# Patient Record
Sex: Female | Born: 1970
Health system: Southern US, Community
[De-identification: ages and names within clinical notes are randomized; demographics above are authoritative.]

## PROBLEM LIST (undated history)

## (undated) DIAGNOSIS — G43909 Migraine, unspecified, not intractable, without status migrainosus: Secondary | ICD-10-CM

## (undated) DIAGNOSIS — J45909 Unspecified asthma, uncomplicated: Secondary | ICD-10-CM

## (undated) DIAGNOSIS — E079 Disorder of thyroid, unspecified: Secondary | ICD-10-CM

## (undated) DIAGNOSIS — R112 Nausea with vomiting, unspecified: Secondary | ICD-10-CM

## (undated) DIAGNOSIS — Z8489 Family history of other specified conditions: Secondary | ICD-10-CM

## (undated) DIAGNOSIS — F419 Anxiety disorder, unspecified: Secondary | ICD-10-CM

## (undated) DIAGNOSIS — K219 Gastro-esophageal reflux disease without esophagitis: Secondary | ICD-10-CM

## (undated) DIAGNOSIS — F329 Major depressive disorder, single episode, unspecified: Secondary | ICD-10-CM

## (undated) DIAGNOSIS — R200 Anesthesia of skin: Secondary | ICD-10-CM

## (undated) DIAGNOSIS — R202 Paresthesia of skin: Secondary | ICD-10-CM

## (undated) DIAGNOSIS — Z973 Presence of spectacles and contact lenses: Secondary | ICD-10-CM

## (undated) DIAGNOSIS — E119 Type 2 diabetes mellitus without complications: Secondary | ICD-10-CM

## (undated) DIAGNOSIS — Z9889 Other specified postprocedural states: Secondary | ICD-10-CM

## (undated) DIAGNOSIS — F32A Depression, unspecified: Secondary | ICD-10-CM

## (undated) DIAGNOSIS — M542 Cervicalgia: Secondary | ICD-10-CM

## (undated) DIAGNOSIS — G8929 Other chronic pain: Secondary | ICD-10-CM

## (undated) DIAGNOSIS — R51 Headache: Secondary | ICD-10-CM

## (undated) DIAGNOSIS — R519 Headache, unspecified: Secondary | ICD-10-CM

## (undated) HISTORY — DX: Migraine, unspecified, not intractable, without status migrainosus: G43.909

## (undated) HISTORY — DX: Type 2 diabetes mellitus without complications: E11.9

## (undated) HISTORY — DX: Disorder of thyroid, unspecified: E07.9

## (undated) HISTORY — PX: WISDOM TOOTH EXTRACTION: SHX21

---

## 2014-06-20 ENCOUNTER — Other Ambulatory Visit (HOSPITAL_COMMUNITY): Payer: Self-pay | Admitting: Neurosurgery

## 2014-06-29 ENCOUNTER — Other Ambulatory Visit: Payer: Self-pay | Admitting: Neurosurgery

## 2014-06-29 DIAGNOSIS — M542 Cervicalgia: Secondary | ICD-10-CM

## 2014-07-11 NOTE — Pre-Procedure Instructions (Signed)
Wendy ReeveShelia Monroe  07/11/2014   Your procedure is scheduled on:  Wednesday, December 9th  [facility] Admitting at 630 AM.  Call this number if you have problems the morning of surgery: [phone]   Remember:   Do not eat food or drink liquids after midnight Tuesday, July 18, 2014   Take these medicines the morning of surgery with A SIP OF WATER: buPROPion (WELLBUTRIN XL), divalproex (DEPAKOTE), escitalopram (LEXAPRO), fexofenadine (ALLEGRA), lansoprazole (PREVACID), fluticasone (FLONASE) if needed:traMADol (ULTRAM) for pain,  albuterol (PROVENTIL HFA;VENTOLIN HFA) 108 (90 BASE) MCG/ACT inhaler wheezing or shortness of breath ( Bringh inhaler in with you on day of procedure) Stop taking Aspirin,vitamins, and herbal medications (Melatonin) Do not take any NSAIDs ie: Ibuprofen, Advil, Naproxen or any medication containing Aspirin ; stop 5 days prior to procedure Friday, July 14, 2014)  Do not wear jewelry, make-up or nail polish.  Do not wear lotions, powders, or perfume,deodorant.  Do not shave 48 hours prior to surgery.   Do not bring valuables to the hospital.  [facility] is not responsible  for any belongings or valuables.               Contacts, dentures or bridgework may not be worn into surgery.  Leave suitcase in the car. After surgery it may be brought to your room.  For patients admitted to the hospital, discharge time is determined by your treatment team.               Patients discharged the day of surgery will not be allowed to drive home.  Please read over the following fact sheets that you were given: Pain Booklet, Coughing and Deep Breathing, MRSA Information and Surgical Site Infection Prevention  [facility] - Preparing for Surgery  Before surgery, you can play an important role.  Because skin is not sterile, your skin needs to be as free of germs as possible.  You can reduce the number of germs on you skin by washing with CHG (chlorahexidine  gluconate) soap before surgery.  CHG is an antiseptic cleaner which kills germs and bonds with the skin to continue killing germs even after washing.  Please DO NOT use if you have an allergy to CHG or antibacterial soaps.  If your skin becomes reddened/irritated stop using the CHG and inform your nurse when you arrive at Short Stay.  Do not shave (including legs and underarms) for at least 48 hours prior to the first CHG shower.  You may shave your face.  Please follow these instructions carefully:   1.  Shower with CHG Soap the night before surgery and the morning of Surgery.  2.  If you choose to wash your hair, wash your hair first as usual with your normal shampoo.  3.  After you shampoo, rinse your hair and body thoroughly to remove the shampoo.  4.  Use CHG as you would any other liquid soap.  You can apply CHG directly to the skin and wash gently with scrungie or a clean washcloth.  5.  Apply the CHG Soap to your body ONLY FROM THE NECK DOWN.  Do not use on open wounds or open sores.  Avoid contact with your eyes, ears, mouth and genitals (private parts).  Wash genitals (private parts) with your normal soap.  6.  Wash thoroughly, paying special attention to the area where your surgery will be performed.  7.  Thoroughly rinse your body with warm water from the neck down.  8.  DO NOT shower/wash with your normal soap after using and rinsing off the CHG Soap.  9.  Pat yourself dry with a clean towel.            10.  Wear clean pajamas.            11.  Place clean sheets on your bed the night of your first shower and do not sleep with pets.  Day of Surgery  Do not apply any lotions/deoderants the morning of surgery.  Please wear clean clothes to the hospital/surgery center.

## 2014-07-12 ENCOUNTER — Encounter (HOSPITAL_COMMUNITY): Payer: Self-pay

## 2014-07-12 ENCOUNTER — Encounter (HOSPITAL_COMMUNITY)
Admission: RE | Admit: 2014-07-12 | Discharge: 2014-07-12 | Disposition: A | Discharge status: Home / Self Care | Payer: PRIVATE HEALTH INSURANCE | Source: Ambulatory Visit | Attending: Neurosurgery | Admitting: Neurosurgery

## 2014-07-12 DIAGNOSIS — Z01812 Encounter for preprocedural laboratory examination: Secondary | ICD-10-CM | POA: Insufficient documentation

## 2014-07-12 HISTORY — DX: Anxiety disorder, unspecified: F41.9

## 2014-07-12 HISTORY — DX: Unspecified asthma, uncomplicated: J45.909

## 2014-07-12 HISTORY — DX: Anesthesia of skin: R20.0

## 2014-07-12 HISTORY — DX: Gastro-esophageal reflux disease without esophagitis: K21.9

## 2014-07-12 HISTORY — DX: Nausea with vomiting, unspecified: R11.2

## 2014-07-12 HISTORY — DX: Headache, unspecified: R51.9

## 2014-07-12 HISTORY — DX: Family history of other specified conditions: Z84.89

## 2014-07-12 HISTORY — DX: Major depressive disorder, single episode, unspecified: F32.9

## 2014-07-12 HISTORY — DX: Other chronic pain: G89.29

## 2014-07-12 HISTORY — DX: Other specified postprocedural states: Z98.890

## 2014-07-12 HISTORY — DX: Depression, unspecified: F32.A

## 2014-07-12 HISTORY — DX: Anesthesia of skin: R20.2

## 2014-07-12 HISTORY — DX: Cervicalgia: M54.2

## 2014-07-12 HISTORY — DX: Presence of spectacles and contact lenses: Z97.3

## 2014-07-12 HISTORY — DX: Headache: R51

## 2014-07-12 LAB — BASIC METABOLIC PANEL
Anion gap: 13 (ref 5–15)
BUN: 10 mg/dL (ref 6–23)
CO2: 25 meq/L (ref 19–32)
Calcium: 9 mg/dL (ref 8.4–10.5)
Chloride: 101 mEq/L (ref 96–112)
Creatinine, Ser: 0.67 mg/dL (ref 0.50–1.10)
GFR calc Af Amer: >90 mL/min (ref >90)
Glucose, Bld: 140 mg/dL — ABNORMAL HIGH (ref 70–99)
Potassium: 4.3 mEq/L (ref 3.7–5.3)
SODIUM: 139 meq/L (ref 137–147)

## 2014-07-12 LAB — CBC
HCT: 38.6 % (ref 36.0–46.0)
Hemoglobin: 12.8 g/dL (ref 12.0–15.0)
MCH: 29.1 pg (ref 26.0–34.0)
MCHC: 33.2 g/dL (ref 30.0–36.0)
MCV: 87.7 fL (ref 78.0–100.0)
PLATELETS: 401 10*3/uL — AB (ref 150–400)
RBC: 4.4 MIL/uL (ref 3.87–5.11)
RDW: 13.2 % (ref 11.5–15.5)
WBC: 10.7 10*3/uL — ABNORMAL HIGH (ref 4.0–10.5)

## 2014-07-12 LAB — SURGICAL PCR SCREEN
MRSA, PCR: NEGATIVE
Staphylococcus aureus: POSITIVE — AB

## 2014-07-12 LAB — HCG, SERUM, QUALITATIVE: Preg, Serum: NEGATIVE

## 2014-07-12 NOTE — Progress Notes (Signed)
Pt denies SOB, chest pain, and being under the care of a cardiologist. Pt denies having a chest x ray and EKG within the last year. Pt denies having an echo and cardiac cath but stated that a stress test was done at [facility]; results requested along with sleep study test results, LOV notes and any other cardiac studies.

## 2014-07-14 ENCOUNTER — Other Ambulatory Visit: Payer: PRIVATE HEALTH INSURANCE

## 2014-07-17 ENCOUNTER — Ambulatory Visit
Admission: RE | Admit: 2014-07-17 | Discharge: 2014-07-17 | Disposition: A | Discharge status: Home / Self Care | Payer: PRIVATE HEALTH INSURANCE | Source: Ambulatory Visit | Attending: Neurosurgery | Admitting: Neurosurgery

## 2014-07-17 DIAGNOSIS — M542 Cervicalgia: Secondary | ICD-10-CM

## 2014-07-19 ENCOUNTER — Inpatient Hospital Stay (HOSPITAL_COMMUNITY)
Admission: RE | Admit: 2014-07-19 | Discharge: 2014-07-21 | DRG: 473 | Disposition: A | Discharge status: Home / Self Care | Payer: PRIVATE HEALTH INSURANCE | Source: Ambulatory Visit | Attending: Neurosurgery | Admitting: Neurosurgery

## 2014-07-19 ENCOUNTER — Inpatient Hospital Stay (HOSPITAL_COMMUNITY): Payer: PRIVATE HEALTH INSURANCE

## 2014-07-19 ENCOUNTER — Inpatient Hospital Stay (HOSPITAL_COMMUNITY): Payer: PRIVATE HEALTH INSURANCE | Admitting: Certified Registered"

## 2014-07-19 ENCOUNTER — Encounter (HOSPITAL_COMMUNITY): Payer: Self-pay | Admitting: Surgery

## 2014-07-19 ENCOUNTER — Encounter (HOSPITAL_COMMUNITY)
Admission: RE | Disposition: A | Discharge status: Home / Self Care | Payer: Self-pay | Source: Ambulatory Visit | Attending: Neurosurgery

## 2014-07-19 DIAGNOSIS — F329 Major depressive disorder, single episode, unspecified: Secondary | ICD-10-CM | POA: Diagnosis present

## 2014-07-19 DIAGNOSIS — M542 Cervicalgia: Secondary | ICD-10-CM | POA: Diagnosis present

## 2014-07-19 DIAGNOSIS — J45909 Unspecified asthma, uncomplicated: Secondary | ICD-10-CM | POA: Diagnosis present

## 2014-07-19 DIAGNOSIS — F419 Anxiety disorder, unspecified: Secondary | ICD-10-CM | POA: Diagnosis present

## 2014-07-19 DIAGNOSIS — K219 Gastro-esophageal reflux disease without esophagitis: Secondary | ICD-10-CM | POA: Diagnosis present

## 2014-07-19 DIAGNOSIS — M532X2 Spinal instabilities, cervical region: Principal | ICD-10-CM | POA: Diagnosis present

## 2014-07-19 DIAGNOSIS — Z79899 Other long term (current) drug therapy: Secondary | ICD-10-CM

## 2014-07-19 DIAGNOSIS — M438X1 Other specified deforming dorsopathies, occipito-atlanto-axial region: Secondary | ICD-10-CM | POA: Diagnosis present

## 2014-07-19 DIAGNOSIS — Z419 Encounter for procedure for purposes other than remedying health state, unspecified: Secondary | ICD-10-CM

## 2014-07-19 HISTORY — PX: POSTERIOR CERVICAL FUSION/FORAMINOTOMY: SHX5038

## 2014-07-19 SURGERY — POSTERIOR CERVICAL FUSION/FORAMINOTOMY LEVEL 1
Anesthesia: General

## 2014-07-19 MED ORDER — PROPOFOL 10 MG/ML IV BOLUS
INTRAVENOUS | Status: DC | PRN
  Administered 2014-07-19: 200 mg via INTRAVENOUS

## 2014-07-19 MED ORDER — LORATADINE 10 MG PO TABS
10.0000 mg | ORAL_TABLET | Freq: Every day | ORAL | Status: DC
  Administered 2014-07-20: 10 mg via ORAL
  Filled 2014-07-19 (×2): qty 1

## 2014-07-19 MED ORDER — SUFENTANIL CITRATE 50 MCG/ML IV SOLN
INTRAVENOUS | Status: AC
  Filled 2014-07-19: qty 1

## 2014-07-19 MED ORDER — ONDANSETRON HCL 4 MG/2ML IJ SOLN
INTRAMUSCULAR | Status: AC
  Filled 2014-07-19: qty 4

## 2014-07-19 MED ORDER — SODIUM CHLORIDE 0.9 % IJ SOLN
INTRAMUSCULAR | Status: AC
  Filled 2014-07-19: qty 10

## 2014-07-19 MED ORDER — HYDROMORPHONE HCL 1 MG/ML IJ SOLN
0.2500 mg | INTRAMUSCULAR | Status: DC | PRN
  Administered 2014-07-19 (×4): 0.5 mg via INTRAVENOUS

## 2014-07-19 MED ORDER — DEXAMETHASONE SODIUM PHOSPHATE 4 MG/ML IJ SOLN
INTRAMUSCULAR | Status: DC | PRN
  Administered 2014-07-19: 4 mg via INTRAVENOUS

## 2014-07-19 MED ORDER — LIDOCAINE HCL (CARDIAC) 20 MG/ML IV SOLN
INTRAVENOUS | Status: DC | PRN
  Administered 2014-07-19: 50 mg via INTRAVENOUS

## 2014-07-19 MED ORDER — PROPOFOL 10 MG/ML IV BOLUS
INTRAVENOUS | Status: AC
  Filled 2014-07-19: qty 20

## 2014-07-19 MED ORDER — MEPERIDINE HCL 25 MG/ML IJ SOLN: 6.2500 mg | INTRAMUSCULAR | Status: DC | PRN

## 2014-07-19 MED ORDER — MIDAZOLAM HCL 5 MG/5ML IJ SOLN
INTRAMUSCULAR | Status: DC | PRN
  Administered 2014-07-19: 2 mg via INTRAVENOUS

## 2014-07-19 MED ORDER — BUPIVACAINE-EPINEPHRINE 0.5% -1:200000 IJ SOLN
INTRAMUSCULAR | Status: DC | PRN
  Administered 2014-07-19: 10 mL

## 2014-07-19 MED ORDER — HYDROCODONE-ACETAMINOPHEN 5-325 MG PO TABS
1.0000 | ORAL_TABLET | ORAL | Status: DC | PRN
  Administered 2014-07-21: 2 via ORAL
  Filled 2014-07-19: qty 2

## 2014-07-19 MED ORDER — CEFAZOLIN SODIUM-DEXTROSE 2-3 GM-% IV SOLR
INTRAVENOUS | Status: AC
  Filled 2014-07-19: qty 50

## 2014-07-19 MED ORDER — MENTHOL 3 MG MT LOZG
1.0000 | LOZENGE | OROMUCOSAL | Status: DC | PRN
  Filled 2014-07-19 (×2): qty 9

## 2014-07-19 MED ORDER — MIDAZOLAM HCL 2 MG/2ML IJ SOLN
INTRAMUSCULAR | Status: AC
  Filled 2014-07-19: qty 2

## 2014-07-19 MED ORDER — ONDANSETRON HCL 4 MG/2ML IJ SOLN
4.0000 mg | INTRAMUSCULAR | Status: DC | PRN
Start: 2014-07-19 — End: 2014-07-21
  Administered 2014-07-19: 4 mg via INTRAVENOUS
  Filled 2014-07-19: qty 2

## 2014-07-19 MED ORDER — HYDROMORPHONE HCL 1 MG/ML IJ SOLN
INTRAMUSCULAR | Status: AC
  Administered 2014-07-19: 0.5 mg via INTRAVENOUS
  Filled 2014-07-19: qty 1

## 2014-07-19 MED ORDER — DIVALPROEX SODIUM 250 MG PO DR TAB
250.0000 mg | DELAYED_RELEASE_TABLET | Freq: Two times a day (BID) | ORAL | Status: DC
  Filled 2014-07-19: qty 1

## 2014-07-19 MED ORDER — THROMBIN 20000 UNITS EX SOLR
CUTANEOUS | Status: DC | PRN
  Administered 2014-07-19: 10:00:00 via TOPICAL

## 2014-07-19 MED ORDER — SCOPOLAMINE 1 MG/3DAYS TD PT72
MEDICATED_PATCH | TRANSDERMAL | Status: DC | PRN
  Administered 2014-07-19: 1 via TRANSDERMAL

## 2014-07-19 MED ORDER — PHENYLEPHRINE HCL 10 MG/ML IJ SOLN
10.0000 mg | INTRAMUSCULAR | Status: DC | PRN
  Administered 2014-07-19: 20 ug/min via INTRAVENOUS

## 2014-07-19 MED ORDER — NORETHINDRONE 0.35 MG PO TABS
1.0000 | ORAL_TABLET | Freq: Every day | ORAL | Status: DC
  Administered 2014-07-19 – 2014-07-20 (×2): 0.35 mg via ORAL

## 2014-07-19 MED ORDER — PROMETHAZINE HCL 25 MG/ML IJ SOLN: 6.2500 mg | INTRAMUSCULAR | Status: DC | PRN

## 2014-07-19 MED ORDER — BUPROPION HCL ER (XL) 300 MG PO TB24
300.0000 mg | ORAL_TABLET | Freq: Every day | ORAL | Status: DC
  Administered 2014-07-20: 300 mg via ORAL
  Filled 2014-07-19 (×2): qty 1

## 2014-07-19 MED ORDER — GLYCOPYRROLATE 0.2 MG/ML IJ SOLN
INTRAMUSCULAR | Status: AC
  Filled 2014-07-19: qty 2

## 2014-07-19 MED ORDER — HEMOSTATIC AGENTS (NO CHARGE) OPTIME
TOPICAL | Status: DC | PRN
  Administered 2014-07-19: 1 via TOPICAL

## 2014-07-19 MED ORDER — ESCITALOPRAM OXALATE 10 MG PO TABS
10.0000 mg | ORAL_TABLET | Freq: Every day | ORAL | Status: DC
  Administered 2014-07-20: 10 mg via ORAL
  Filled 2014-07-19 (×2): qty 1

## 2014-07-19 MED ORDER — OXYCODONE-ACETAMINOPHEN 5-325 MG PO TABS
1.0000 | ORAL_TABLET | ORAL | Status: DC | PRN
  Administered 2014-07-19 – 2014-07-21 (×8): 2 via ORAL
  Filled 2014-07-19 (×8): qty 2

## 2014-07-19 MED ORDER — LIDOCAINE HCL (CARDIAC) 20 MG/ML IV SOLN
INTRAVENOUS | Status: AC
  Filled 2014-07-19: qty 10

## 2014-07-19 MED ORDER — DIAZEPAM 5 MG PO TABS
5.0000 mg | ORAL_TABLET | Freq: Four times a day (QID) | ORAL | Status: DC | PRN
  Administered 2014-07-19 – 2014-07-21 (×7): 5 mg via ORAL
  Filled 2014-07-19 (×7): qty 1

## 2014-07-19 MED ORDER — PROMETHAZINE HCL 25 MG/ML IJ SOLN
12.5000 mg | Freq: Four times a day (QID) | INTRAMUSCULAR | Status: DC | PRN
  Administered 2014-07-19: 25 mg via INTRAVENOUS
  Filled 2014-07-19: qty 1

## 2014-07-19 MED ORDER — MORPHINE SULFATE 2 MG/ML IJ SOLN
1.0000 mg | INTRAMUSCULAR | Status: DC | PRN
  Administered 2014-07-19: 4 mg via INTRAVENOUS
  Administered 2014-07-19 – 2014-07-20 (×4): 2 mg via INTRAVENOUS
  Administered 2014-07-20 – 2014-07-21 (×2): 4 mg via INTRAVENOUS
  Filled 2014-07-19 (×4): qty 1
  Filled 2014-07-19 (×3): qty 2

## 2014-07-19 MED ORDER — ALUM & MAG HYDROXIDE-SIMETH 200-200-20 MG/5ML PO SUSP: 30.0000 mL | Freq: Four times a day (QID) | ORAL | Status: DC | PRN

## 2014-07-19 MED ORDER — BUPIVACAINE LIPOSOME 1.3 % IJ SUSP
INTRAMUSCULAR | Status: DC | PRN
  Administered 2014-07-19: 20 mL

## 2014-07-19 MED ORDER — BACITRACIN 500 UNIT/GM EX OINT
TOPICAL_OINTMENT | CUTANEOUS | Status: DC | PRN
  Administered 2014-07-19: 1 via TOPICAL

## 2014-07-19 MED ORDER — SUFENTANIL CITRATE 50 MCG/ML IV SOLN
INTRAVENOUS | Status: DC | PRN
  Administered 2014-07-19: 10 ug via INTRAVENOUS
  Administered 2014-07-19: 20 ug via INTRAVENOUS
  Administered 2014-07-19 (×2): 10 ug via INTRAVENOUS

## 2014-07-19 MED ORDER — NEOSTIGMINE METHYLSULFATE 10 MG/10ML IV SOLN
INTRAVENOUS | Status: DC | PRN
  Administered 2014-07-19: 3 mg via INTRAVENOUS

## 2014-07-19 MED ORDER — 0.9 % SODIUM CHLORIDE (POUR BTL) OPTIME
TOPICAL | Status: DC | PRN
  Administered 2014-07-19: 1000 mL

## 2014-07-19 MED ORDER — FLUTICASONE PROPIONATE 50 MCG/ACT NA SUSP: 1.0000 | Freq: Every day | NASAL | Status: DC | PRN

## 2014-07-19 MED ORDER — ONDANSETRON HCL 4 MG/2ML IJ SOLN
INTRAMUSCULAR | Status: AC
  Filled 2014-07-19: qty 2

## 2014-07-19 MED ORDER — CEFAZOLIN SODIUM-DEXTROSE 2-3 GM-% IV SOLR
2.0000 g | INTRAVENOUS | Status: AC
  Administered 2014-07-19 (×2): 2 g via INTRAVENOUS

## 2014-07-19 MED ORDER — DEXAMETHASONE SODIUM PHOSPHATE 4 MG/ML IJ SOLN
INTRAMUSCULAR | Status: AC
  Filled 2014-07-19: qty 1

## 2014-07-19 MED ORDER — PHENYLEPHRINE HCL 10 MG/ML IJ SOLN
INTRAMUSCULAR | Status: DC | PRN
  Administered 2014-07-19 (×3): 80 ug via INTRAVENOUS

## 2014-07-19 MED ORDER — ACETAMINOPHEN 325 MG PO TABS: 650.0000 mg | ORAL_TABLET | ORAL | Status: DC | PRN

## 2014-07-19 MED ORDER — GLYCOPYRROLATE 0.2 MG/ML IJ SOLN
INTRAMUSCULAR | Status: AC
  Filled 2014-07-19: qty 4

## 2014-07-19 MED ORDER — PANTOPRAZOLE SODIUM 40 MG PO TBEC
40.0000 mg | DELAYED_RELEASE_TABLET | Freq: Every day | ORAL | Status: DC
  Administered 2014-07-20: 40 mg via ORAL
  Filled 2014-07-19: qty 1

## 2014-07-19 MED ORDER — SCOPOLAMINE 1 MG/3DAYS TD PT72
MEDICATED_PATCH | TRANSDERMAL | Status: AC
  Filled 2014-07-19: qty 1

## 2014-07-19 MED ORDER — PROMETHAZINE HCL 25 MG/ML IJ SOLN: 12.5000 mg | Freq: Four times a day (QID) | INTRAMUSCULAR | Status: DC | PRN

## 2014-07-19 MED ORDER — DOCUSATE SODIUM 100 MG PO CAPS
100.0000 mg | ORAL_CAPSULE | Freq: Two times a day (BID) | ORAL | Status: DC
  Administered 2014-07-19 – 2014-07-20 (×3): 100 mg via ORAL
  Filled 2014-07-19 (×6): qty 1

## 2014-07-19 MED ORDER — LACTATED RINGERS IV SOLN
INTRAVENOUS | Status: DC | PRN
  Administered 2014-07-19 (×2): via INTRAVENOUS

## 2014-07-19 MED ORDER — LACTATED RINGERS IV SOLN: INTRAVENOUS | Status: DC

## 2014-07-19 MED ORDER — MONTELUKAST SODIUM 10 MG PO TABS
10.0000 mg | ORAL_TABLET | Freq: Every day | ORAL | Status: DC
  Administered 2014-07-19 – 2014-07-20 (×2): 10 mg via ORAL
  Filled 2014-07-19 (×3): qty 1

## 2014-07-19 MED ORDER — ROCURONIUM BROMIDE 100 MG/10ML IV SOLN
INTRAVENOUS | Status: DC | PRN
  Administered 2014-07-19: 50 mg via INTRAVENOUS

## 2014-07-19 MED ORDER — PHENOL 1.4 % MT LIQD
1.0000 | OROMUCOSAL | Status: DC | PRN
Start: 2014-07-19 — End: 2014-07-21
  Filled 2014-07-19: qty 177

## 2014-07-19 MED ORDER — ACETAMINOPHEN 650 MG RE SUPP: 650.0000 mg | RECTAL | Status: DC | PRN

## 2014-07-19 MED ORDER — THROMBIN 5000 UNITS EX SOLR
CUTANEOUS | Status: DC | PRN
  Administered 2014-07-19: 5000 [IU] via TOPICAL

## 2014-07-19 MED ORDER — BUPIVACAINE LIPOSOME 1.3 % IJ SUSP
20.0000 mL | INTRAMUSCULAR | Status: AC
  Filled 2014-07-19: qty 20

## 2014-07-19 MED ORDER — PHENYLEPHRINE HCL 10 MG/ML IJ SOLN
INTRAMUSCULAR | Status: AC
  Filled 2014-07-19: qty 1

## 2014-07-19 MED ORDER — ROCURONIUM BROMIDE 50 MG/5ML IV SOLN
INTRAVENOUS | Status: AC
  Filled 2014-07-19: qty 1

## 2014-07-19 MED ORDER — GLYCOPYRROLATE 0.2 MG/ML IJ SOLN
INTRAMUSCULAR | Status: DC | PRN
  Administered 2014-07-19: 0.4 mg via INTRAVENOUS

## 2014-07-19 MED ORDER — ONDANSETRON HCL 4 MG/2ML IJ SOLN
INTRAMUSCULAR | Status: DC | PRN
  Administered 2014-07-19: 4 mg via INTRAVENOUS

## 2014-07-19 MED ORDER — CEFAZOLIN SODIUM-DEXTROSE 2-3 GM-% IV SOLR
2.0000 g | Freq: Three times a day (TID) | INTRAVENOUS | Status: AC
  Administered 2014-07-19 – 2014-07-20 (×2): 2 g via INTRAVENOUS
  Filled 2014-07-19 (×4): qty 50

## 2014-07-19 MED ORDER — VECURONIUM BROMIDE 10 MG IV SOLR
INTRAVENOUS | Status: DC | PRN
  Administered 2014-07-19: 1 mg via INTRAVENOUS
  Administered 2014-07-19: 2 mg via INTRAVENOUS
  Administered 2014-07-19 (×2): 1 mg via INTRAVENOUS
  Administered 2014-07-19: 2 mg via INTRAVENOUS
  Administered 2014-07-19: 1 mg via INTRAVENOUS

## 2014-07-19 MED ORDER — PHENYLEPHRINE 40 MCG/ML (10ML) SYRINGE FOR IV PUSH (FOR BLOOD PRESSURE SUPPORT)
PREFILLED_SYRINGE | INTRAVENOUS | Status: AC
  Filled 2014-07-19: qty 10

## 2014-07-19 MED ORDER — SUCCINYLCHOLINE CHLORIDE 20 MG/ML IJ SOLN
INTRAMUSCULAR | Status: AC
  Filled 2014-07-19: qty 3

## 2014-07-19 MED ORDER — ALBUTEROL SULFATE (2.5 MG/3ML) 0.083% IN NEBU: 3.0000 mL | INHALATION_SOLUTION | Freq: Four times a day (QID) | RESPIRATORY_TRACT | Status: DC | PRN

## 2014-07-19 MED ORDER — MIDAZOLAM HCL 2 MG/2ML IJ SOLN: 0.5000 mg | Freq: Once | INTRAMUSCULAR | Status: DC | PRN

## 2014-07-19 SURGICAL SUPPLY — 78 items
BAG DECANTER FOR FLEXI CONT (MISCELLANEOUS) ×2 IMPLANT
BENZOIN TINCTURE PRP APPL 2/3 (GAUZE/BANDAGES/DRESSINGS) ×2 IMPLANT
BIT DRILL ANGLD TIP SCRW 3.5 (BIT) ×2 IMPLANT
BIT DRILL NEURO 2X3.1 SFT TUCH (MISCELLANEOUS) ×1 IMPLANT
BLADE CLIPPER SURG (BLADE) ×2 IMPLANT
BLADE ULTRA TIP 2M (BLADE) IMPLANT
BRUSH SCRUB EZ 1% IODOPHOR (MISCELLANEOUS) IMPLANT
BRUSH SCRUB EZ PLAIN DRY (MISCELLANEOUS) IMPLANT
BUR ACORN 6.0 ACORN (BURR) ×2 IMPLANT
CABLE SNG STERILE W/CRIMP (Neuro Prosthesis/Implant) ×2 IMPLANT
CANISTER SUCT 3000ML (MISCELLANEOUS) ×2 IMPLANT
CAP ELLIPSE LOCKING (Cap) ×8 IMPLANT
CONT SPEC 4OZ CLIKSEAL STRL BL (MISCELLANEOUS) ×2 IMPLANT
DECANTER SPIKE VIAL GLASS SM (MISCELLANEOUS) ×2 IMPLANT
DRAPE C-ARM 42X72 X-RAY (DRAPES) ×4 IMPLANT
DRAPE LAPAROTOMY 100X72 PEDS (DRAPES) ×2 IMPLANT
DRAPE MICROSCOPE LEICA (MISCELLANEOUS) IMPLANT
DRAPE POUCH INSTRU U-SHP 10X18 (DRAPES) ×2 IMPLANT
DRAPE PROXIMA HALF (DRAPES) ×2 IMPLANT
DRILL NEURO 2X3.1 SOFT TOUCH (MISCELLANEOUS) ×2
ELECT BLADE 4.0 EZ CLEAN MEGAD (MISCELLANEOUS) ×2
ELECT REM PT RETURN 9FT ADLT (ELECTROSURGICAL) ×2
ELECTRODE BLDE 4.0 EZ CLN MEGD (MISCELLANEOUS) ×1 IMPLANT
ELECTRODE REM PT RTRN 9FT ADLT (ELECTROSURGICAL) ×1 IMPLANT
GAUZE SPONGE 4X4 12PLY STRL (GAUZE/BANDAGES/DRESSINGS) ×2 IMPLANT
GAUZE SPONGE 4X4 16PLY XRAY LF (GAUZE/BANDAGES/DRESSINGS) IMPLANT
GLOVE BIO SURGEON STRL SZ8.5 (GLOVE) ×4 IMPLANT
GLOVE BIOGEL PI IND STRL 8 (GLOVE) ×1 IMPLANT
GLOVE BIOGEL PI INDICATOR 8 (GLOVE) ×1
GLOVE EXAM NITRILE LRG STRL (GLOVE) IMPLANT
GLOVE EXAM NITRILE MD LF STRL (GLOVE) IMPLANT
GLOVE EXAM NITRILE XL STR (GLOVE) IMPLANT
GLOVE EXAM NITRILE XS STR PU (GLOVE) IMPLANT
GLOVE INDICATOR 7.0 STRL GRN (GLOVE) ×4 IMPLANT
GLOVE SS BIOGEL STRL SZ 8 (GLOVE) ×2 IMPLANT
GLOVE SS N UNI LF 6.5 STRL (GLOVE) ×6 IMPLANT
GLOVE SUPERSENSE BIOGEL SZ 8 (GLOVE) ×2
GOWN STRL REUS W/ TWL LRG LVL3 (GOWN DISPOSABLE) ×2 IMPLANT
GOWN STRL REUS W/ TWL XL LVL3 (GOWN DISPOSABLE) ×3 IMPLANT
GOWN STRL REUS W/TWL 2XL LVL3 (GOWN DISPOSABLE) IMPLANT
GOWN STRL REUS W/TWL LRG LVL3 (GOWN DISPOSABLE) ×2
GOWN STRL REUS W/TWL XL LVL3 (GOWN DISPOSABLE) ×3
HEMOSTAT SURGICEL 2X14 (HEMOSTASIS) ×2 IMPLANT
KIT BASIN OR (CUSTOM PROCEDURE TRAY) ×2 IMPLANT
KIT INFUSE XX SMALL 0.7CC (Orthopedic Implant) ×2 IMPLANT
KIT ROOM TURNOVER OR (KITS) ×2 IMPLANT
NEEDLE HYPO 22GX1.5 SAFETY (NEEDLE) ×2 IMPLANT
NEEDLE SPNL 18GX3.5 QUINCKE PK (NEEDLE) ×2 IMPLANT
NS IRRIG 1000ML POUR BTL (IV SOLUTION) ×2 IMPLANT
PACK LAMINECTOMY NEURO (CUSTOM PROCEDURE TRAY) ×2 IMPLANT
PAD ARMBOARD 7.5X6 YLW CONV (MISCELLANEOUS) ×6 IMPLANT
PATTIES SURGICAL .25X.25 (GAUZE/BANDAGES/DRESSINGS) ×2 IMPLANT
PATTIES SURGICAL .5 X.5 (GAUZE/BANDAGES/DRESSINGS) ×2 IMPLANT
PATTIES SURGICAL 1X1 (DISPOSABLE) ×2 IMPLANT
PIN MAYFIELD SKULL DISP (PIN) ×2 IMPLANT
ROD SPINE POST 3.5X80 (Rod) ×2 IMPLANT
RUBBERBAND STERILE (MISCELLANEOUS) IMPLANT
SCREW 4.0X16 (Screw) ×2 IMPLANT
SCREW ELLIPSE POLY 4.0X30 (Screw) ×4 IMPLANT
SCREW ELLIPSE POLY 4.0X34 (Screw) IMPLANT
SCREW POLYAXIAL ELLIPSE 4X14 (Screw) ×2 IMPLANT
SPONGE LAP 4X18 X RAY DECT (DISPOSABLE) IMPLANT
SPONGE NEURO XRAY DETECT 1X3 (DISPOSABLE) IMPLANT
SPONGE SURGIFOAM ABS GEL 100 (HEMOSTASIS) ×2 IMPLANT
SPONGE SURGIFOAM ABS GEL SZ50 (HEMOSTASIS) ×2 IMPLANT
STAPLER SKIN PROX WIDE 3.9 (STAPLE) IMPLANT
STRIP CLOSURE SKIN 1/2X4 (GAUZE/BANDAGES/DRESSINGS) ×2 IMPLANT
STRIP ILIUM TRICORT 2.2CMX60MM (Neuro Prosthesis/Implant) ×2 IMPLANT
SUT ETHILON 2 0 FS 18 (SUTURE) IMPLANT
SUT VIC AB 0 CT1 18XCR BRD8 (SUTURE) ×1 IMPLANT
SUT VIC AB 0 CT1 8-18 (SUTURE) ×1
SUT VIC AB 2-0 CP2 18 (SUTURE) ×2 IMPLANT
SYR 20ML ECCENTRIC (SYRINGE) ×2 IMPLANT
TAPE CLOTH SURG 4X10 WHT LF (GAUZE/BANDAGES/DRESSINGS) ×2 IMPLANT
TOWEL OR 17X24 6PK STRL BLUE (TOWEL DISPOSABLE) ×2 IMPLANT
TOWEL OR 17X26 10 PK STRL BLUE (TOWEL DISPOSABLE) ×2 IMPLANT
TRAY FOLEY CATH 14FRSI W/METER (CATHETERS) ×2 IMPLANT
WATER STERILE IRR 1000ML POUR (IV SOLUTION) ×2 IMPLANT

## 2014-07-19 NOTE — Plan of Care (Signed)
Problem: Consults Goal: Spinal Surgery Patient Education See Patient Education Module for education specifics. Outcome: Completed/Met Date Met:  07/19/14     

## 2014-07-19 NOTE — Anesthesia Preprocedure Evaluation (Addendum)
Anesthesia Evaluation  Patient identified by MRN, date of birth, ID band Patient awake    Reviewed: Allergy & Precautions, H&P , NPO status , Patient's Chart, lab work & pertinent test results, reviewed documented beta blocker date and time   History of Anesthesia Complications (+) PONV, Family history of anesthesia reaction and history of anesthetic complications  Airway Mallampati: I  TM Distance: >3 FB Neck ROM: full    Dental  (+) Teeth Intact, Dental Advisory Given   Pulmonary asthma ,  breath sounds clear to auscultation        Cardiovascular Exercise Tolerance: Good Rhythm:regular Rate:Normal     Neuro/Psych  Headaches, PSYCHIATRIC DISORDERS Anxiety Depression    GI/Hepatic GERD-  Controlled,  Endo/Other    Renal/GU      Musculoskeletal   Abdominal   Peds  Hematology   Anesthesia Other Findings   Reproductive/Obstetrics                            Anesthesia Physical Anesthesia Plan  ASA: II  Anesthesia Plan: General ETT   Post-op Pain Management:    Induction: Intravenous  Airway Management Planned: Video Laryngoscope Planned and Oral ETT  Additional Equipment: None  Intra-op Plan:   Post-operative Plan: Extubation in OR  Informed Consent: I have reviewed the patients History and Physical, chart, labs and discussed the procedure including the risks, benefits and alternatives for the proposed anesthesia with the patient or authorized representative who has indicated his/her understanding and acceptance.   Dental Advisory Given  Plan Discussed with: CRNA, Surgeon and Anesthesiologist  Anesthesia Plan Comments:        Anesthesia Quick Evaluation

## 2014-07-19 NOTE — Anesthesia Postprocedure Evaluation (Signed)
  Anesthesia Post Note  Patient: Wendy ReeveShelia Bollman  Procedure(s) Performed: Procedure(s) (LRB): POSTERIOR CERVICAL FUSION/FORAMINOTOMY CERVICAL ONE-TWO WITH INSTRUMENTATION (N/A)  Anesthesia type: GA  Patient location: PACU  Post pain: Pain level controlled  Post assessment: Post-op Vital signs reviewed  Last Vitals:  Filed Vitals:   07/19/14 0623  BP: 135/67  Pulse: 84  Temp: 36.9 C  Resp: 18    Post vital signs: Reviewed  Level of consciousness: sedated  Complications: No apparent anesthesia complications

## 2014-07-19 NOTE — Transfer of Care (Signed)
Immediate Anesthesia Transfer of Care Note  Patient: Wendy ReeveShelia Monroe  Procedure(s) Performed: Procedure(s): POSTERIOR CERVICAL FUSION/FORAMINOTOMY CERVICAL ONE-TWO WITH INSTRUMENTATION (N/A)  Patient Location: PACU  Anesthesia Type:General  Level of Consciousness: awake, alert  and oriented  Airway & Oxygen Therapy: Patient Spontanous Breathing and Patient connected to nasal cannula oxygen  Post-op Assessment: Report given to PACU RN, Post -op Vital signs reviewed and stable and Patient moving all extremities  Post vital signs: Reviewed and stable  Complications: No apparent anesthesia complications

## 2014-07-19 NOTE — Plan of Care (Signed)
Problem: Consults Goal: Diagnosis - Spinal Surgery Outcome: Completed/Met Date Met:  07/19/14 Cervical Spine Fusion

## 2014-07-19 NOTE — Anesthesia Procedure Notes (Signed)
Procedure Name: Intubation Date/Time: 07/19/2014 8:40 AM Performed by: Charm BargesBUTLER, Hampton Cost R Pre-anesthesia Checklist: Patient identified, Emergency Drugs available, Suction available, Patient being monitored and Timeout performed Patient Re-evaluated:Patient Re-evaluated prior to inductionOxygen Delivery Method: Circle system utilized Preoxygenation: Pre-oxygenation with 100% oxygen Intubation Type: IV induction Ventilation: Mask ventilation without difficulty Grade View: Grade II Tube type: Oral Tube size: 7.5 mm Number of attempts: 1 Airway Equipment and Method: Rigid stylet and Video-laryngoscopy Placement Confirmation: ETT inserted through vocal cords under direct vision,  positive ETCO2 and breath sounds checked- equal and bilateral Secured at: 21 cm Tube secured with: Tape Dental Injury: Teeth and Oropharynx as per pre-operative assessment

## 2014-07-19 NOTE — H&P (Signed)
Subjective: Patient is a 43 year old white female who has complained of neck pain and stiffness with occasional numbness and tingling in her extremities. She was worked up with cervical x-rays and a cervical CT which demonstrated the patient had a os odontoideum. Flexion extension x-rays demonstrated instability. I discussed the various treatment options with the patient including surgery. She has decided proceed with a C1-2 instrumentation and fusion.   Past Medical History  Diagnosis Date  . PONV (postoperative nausea and vomiting)   . Family history of adverse reaction to anesthesia     Mother had PONV  . Asthma   . Anxiety   . Chronic neck pain   . Headache     Migraines  . Wears glasses   . Depression   . GERD (gastroesophageal reflux disease)   . Numbness and tingling of right arm     Past Surgical History  Procedure Laterality Date  . Wisdom tooth extraction      Allergies  Allergen Reactions  . Erythromycin Other (See Comments)    "Pain worse than labor"  . Penicillins Hives  . Prednisone Other (See Comments)    High doses of prednisone:"make me crazy"  . Sulfa Antibiotics Hives    History  Substance Use Topics  . Smoking status: Never Smoker   . Smokeless tobacco: Never Used  . Alcohol Use: No    Family History  Problem Relation Age of Onset  . Asthma Mother   . Thyroid disease Mother   . Cancer - Lung Father   . COPD Sister    Prior to Admission medications   Medication Sig Start Date End Date Taking? Authorizing Provider  albuterol (PROVENTIL HFA;VENTOLIN HFA) 108 (90 BASE) MCG/ACT inhaler Inhale 1-2 puffs into the lungs every 6 (six) hours as needed for wheezing or shortness of breath.   Yes Historical Provider, MD  buPROPion (WELLBUTRIN XL) 300 MG 24 hr tablet Take 300 mg by mouth daily.   Yes Historical Provider, MD  divalproex (DEPAKOTE) 250 MG DR tablet Take 250 mg by mouth 2 (two) times daily.   Yes Historical Provider, MD  escitalopram (LEXAPRO) 10  MG tablet Take 10 mg by mouth daily.   Yes Historical Provider, MD  fexofenadine (ALLEGRA) 180 MG tablet Take 180 mg by mouth daily.   Yes Historical Provider, MD  ibuprofen (ADVIL,MOTRIN) 800 MG tablet Take 800 mg by mouth 4 (four) times daily as needed for headache (pain).   Yes Historical Provider, MD  lansoprazole (PREVACID) 30 MG capsule Take 30 mg by mouth daily at 12 noon.   Yes Historical Provider, MD  Melatonin 5 MG TABS Take 5 mg by mouth at bedtime.   Yes Historical Provider, MD  montelukast (SINGULAIR) 10 MG tablet Take 10 mg by mouth at bedtime.   Yes Historical Provider, MD  Multiple Minerals-Vitamins (CALCIUM-MAGNESIUM-ZINC-D3) TABS Take 1 tablet by mouth daily.   Yes Historical Provider, MD  norethindrone (MICRONOR,CAMILA,ERRIN) 0.35 MG tablet Take 1 tablet by mouth daily.   Yes Historical Provider, MD  ALPRAZolam Prudy Feeler(XANAX) 0.5 MG tablet Take 0.5 mg by mouth every 6 (six) hours as needed for anxiety.    Historical Provider, MD  fluticasone (FLONASE) 50 MCG/ACT nasal spray Place 1 spray into both nostrils daily.    Historical Provider, MD  traMADol (ULTRAM) 50 MG tablet Take 50-100 mg by mouth daily.    Historical Provider, MD     Review of Systems  Positive ROS: As above  All other systems have been reviewed  and were otherwise negative with the exception of those mentioned in the HPI and as above.  Objective: Vital signs in last 24 hours: Temp:  [98.5 F (36.9 C)] 98.5 F (36.9 C) (12/09 0623) Pulse Rate:  [84] 84 (12/09 0623) Resp:  [18] 18 (12/09 0623) BP: (135)/(67) 135/67 mmHg (12/09 0623) SpO2:  [98 %] 98 % (12/09 0623)  General Appearance: Alert, cooperative, no distress, Head: Normocephalic, without obvious abnormality, atraumatic Eyes: PERRL, conjunctiva/corneas clear, EOM's intact,    Ears: Normal  Throat: Normal  Neck: Supple, symmetrical, trachea midline, no adenopathy; thyroid: No enlargement/tenderness/nodules; no carotid bruit or JVD Back: Symmetric, no  curvature, ROM normal, no CVA tenderness Lungs: Clear to auscultation bilaterally, respirations unlabored Heart: Regular rate and rhythm, no murmur, rub or gallop Abdomen: Soft, non-tender,, no masses, no organomegaly Extremities: Extremities normal, atraumatic, no cyanosis or edema Pulses: 2+ and symmetric all extremities Skin: Skin color, texture, turgor normal, no rashes or lesions  NEUROLOGIC:   Mental status: alert and oriented, no aphasia, good attention span, Fund of knowledge/ memory ok Motor Exam - grossly normal Sensory Exam - grossly normal Reflexes:  Coordination - grossly normal Gait - grossly normal Balance - grossly normal Cranial Nerves: I: smell Not tested  II: visual acuity  OS: Normal  OD: Normal   II: visual fields Full to confrontation  II: pupils Equal, round, reactive to light  III,VII: ptosis None  III,IV,VI: extraocular muscles  Full ROM  V: mastication Normal  V: facial light touch sensation  Normal  V,VII: corneal reflex  Present  VII: facial muscle function - upper  Normal  VII: facial muscle function - lower Normal  VIII: hearing Not tested  IX: soft palate elevation  Normal  IX,X: gag reflex Present  XI: trapezius strength  5/5  XI: sternocleidomastoid strength 5/5  XI: neck flexion strength  5/5  XII: tongue strength  Normal    Data Review Lab Results  Component Value Date   WBC 10.7* 07/12/2014   HGB 12.8 07/12/2014   HCT 38.6 07/12/2014   MCV 87.7 07/12/2014   PLT 401* 07/12/2014   Lab Results  Component Value Date   NA 139 07/12/2014   K 4.3 07/12/2014   CL 101 07/12/2014   CO2 25 07/12/2014   BUN 10 07/12/2014   CREATININE 0.67 07/12/2014   GLUCOSE 140* 07/12/2014   No results found for: INR, PROTIME  Assessment/Plan: Os odontoideum, cervical instability, cervicalgia: I have discussed the situation with the patient. I have reviewed her imaging studies with her and pointed out the abnormalities. We have discussed the  various treatment options including surgery. I have described the surgical treatment option of a C1-2 posterior inspissation and fusion. I have shown her surgical models. We have discussed the risks, benefits, alternatives, and likelihood of achieving her goals with surgery. I have answered all the patient's questions. She has decided proceed with surgery.   Dan Scearce D 07/19/2014 8:12 AM

## 2014-07-19 NOTE — Progress Notes (Signed)
Subjective:  The patient is somnolent but easily arousable.  She is in no apparent distress. She looks well.  Objective: Vital signs in last 24 hours: Temp:  [98.5 F (36.9 C)-99.4 F (37.4 C)] 99.4 F (37.4 C) (12/09 1322) Pulse Rate:  [84] 84 (12/09 0623) Resp:  [18] 18 (12/09 0623) BP: (127-135)/(67-68) 127/68 mmHg (12/09 1330) SpO2:  [98 %] 98 % (12/09 0623)  Intake/Output from previous day:   Intake/Output this shift: Total I/O In: 1800 [I.V.:1800] Out: 400 [Urine:250; Blood:150]  Physical exam the patient is somnolent but easily arousable. She is moving all 4 extremities well.  Lab Results: No results for input(s): WBC, HGB, HCT, PLT in the last 72 hours. BMET No results for input(s): NA, K, CL, CO2, GLUCOSE, BUN, CREATININE, CALCIUM in the last 72 hours.  Studies/Results: Dg Cervical Spine 1 View  07/19/2014   CLINICAL DATA:  Cervical fusion.  EXAM: CERVICAL SPINE  4+ VIEWS  COMPARISON:  CT scan 07/17/2014  FINDINGS: Fluoroscopic spot images demonstrate posterior screws at C1 and C2. Good position and alignment without complicating features.  IMPRESSION: C1-2 fusion screws in good position without complicating features.   Electronically Signed   By: Loralie ChampagneMark  Gallerani M.D.   On: 07/19/2014 13:28   Ct Cervical Spine Wo Contrast  07/17/2014   CLINICAL DATA:  43 year old female with right hand numbness and tingling for 3 months. No history of trauma or cancer. Preoperative exam. Subsequent encounter.  EXAM: CT CERVICAL SPINE WITHOUT CONTRAST  TECHNIQUE: Multidetector CT imaging of the cervical spine was performed without intravenous contrast. Multiplanar CT image reconstructions were also generated.  COMPARISON:  05/25/2014 and 05/23/2014 plain film exam. No comparison MR.  FINDINGS: Exam is slightly limited by patient's habitus.  Present examination includes from the clivus to the upper T5 level.  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 Transverse  ligament hypertrophy. Spinal stenosis with mild cord flattening.  No other a osseous abnormality is noted.  C3-4: Minimal bulge.  C4-5:  Minimal to mild bulge greater to left.  C5-6: Mild bulge greater to left.  C6-7: Negative.  Evaluation limited by habitus.  C7-T1:  Negative.  Evaluation limited by habitus.  Visualized paravertebral structures and lung apices are unremarkable.  IMPRESSION: Exam is slightly limited by patient's habitus.  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 Spinal stenosis with mild cord flattening.  No other a osseous abnormality is noted.  C3-4 minimal bulge.  C4-5 minimal to mild bulge greater to left.  C5-6 mild bulge greater to left.   Electronically Signed   By: Bridgett LarssonSteve  Olson M.D.   On: 07/17/2014 18:24   Dg C-arm 1-60 Min  07/19/2014   CLINICAL DATA:  Cervical fusion.  EXAM: CERVICAL SPINE  4+ VIEWS  COMPARISON:  CT scan 07/17/2014  FINDINGS: Fluoroscopic spot images demonstrate posterior screws at C1 and C2. Good position and alignment without complicating features.  IMPRESSION: C1-2 fusion screws in good position without complicating features.   Electronically Signed   By: Loralie ChampagneMark  Gallerani M.D.   On: 07/19/2014 13:28    Assessment/Plan: The patient is doing well. I spoke with her family.  LOS: 0 days     Bary Limbach D 07/19/2014, 2:09 PM

## 2014-07-19 NOTE — Op Note (Signed)
Brief history: The patient is a 43 year old white female who has presented with neck pain and stiffness. She was worked up with x-rays and a cervical CT which demonstrated an unstable os odontoideum. I discussed the various treatment options with the patient. She has weighed the risks, benefits, and alternative surgery and decided proceed with a posterior C1-2 instrumentation and fusion.  Preoperative diagnosis: Os odontoideum, cervical instability, cervicalgia  Postop diagnosis: The same  Procedure: C1-2 posterior arthrodesis with iliac crest tricortical allograft bone, local morselized autograft bone, and bone morphogenic protein-soaked collagen sponges ; sublaminar/interspinous wiring with titanium braided wire at C1/2; posterior segmental instrumentation C1/2 with C1 lateral mass screws and C2 isthmus screws (globus titanium screws and rods)  Surgeon: Dr. Delma OfficerJeff Goldman Birchall  Assistant: Dr. Marikay Alaravid Jones  Anesthesia: Gen. endotracheal  Estimated blood loss: 250 mL  Specimens: None  Drains: None  Complications: None  Description of procedure: The patient was brought to the operating room by the anesthesia team. General endotracheal anesthesia was induced. I applied the radiolucent Mayfield 3-point headrest to the patient's calvarium. The patient was carefully turned to the prone position on the chest rolls. We confirmed the patient's cervical neutral position with intraoperative fluoroscopy. Her head was supported with a Mayfield head rest. The patient's suboccipital region was then shaved with clippers and this region as well as the posterior cervical and upper thoracic region was prepared with Betadine scrub and Betadine solution. Sterile drapes were applied. I then injected the area to be incised with Marcaine with epinephrine solution. I used a scalpel to make a linear midline incision from the occipital cervical junction to approximately the mid cervical region. I used let her cautery to perform  a bilateral subperiosteal dissection exposing the suboccipital skull, C1, C2 and C3 spinous process and lamina. Of note the C2 and C3 facets were fused.  I carefully sected just lateral to the dura at C1-2 and identified the exiting C2 nerve root. I exposed the lateral mass at C1 bilaterally. We identified the lateral edge of the spinal canal as well as the transverse foramen out laterally. Under fluoroscopic guidance I used the high-speed drill to drill hole in the lateral mass aiming in a cephalad and medial direction. We then removed the drill and probe inside the drill hole with a ball probe. There were no cortical breaches. We carefully retracted the C2 nerve root and a caudal direction with the Penfield #4. I inserted a 4 x 30 mm fully threaded screw into the C1 lateral masses bilaterally. We did encounter some vigorous venous bleeding but no arterial bleeding. We controlled this with bipolar electrocautery and Gelfoam.   I then placed the C2 isthmus screws under fluoroscopic guidance. Again I drilled in the cephalad and medial direction. We removed the drill and probe inside the drill hole with a ball probe and ruled out cortical breaches. I inserted a 14 x 4 mm screw on the left and a 16 x 4 mm screw on the right. The screw on the left appeared to be somewhat laterally projecting. I did not replace it because we got good bony purchase, the probe drill hole was fully within bone and there was no cortical breaches and I did not want to strip the bone and have a loose screw. We then connected the unilateral screws with a rod. We secured the rod in place with the caps which were tightened appropriately.  We now turned our attention to the fusion. I obtained an iliac crest tricortical allograft  bone graft. I drilled it to this proper size. I created grooves for the C2 spinous process and the C1 posterior lamina. We carefully decorticated the C1 lamina and the C2 spinous process and lamina with the  high-speed drill. I placed a sublaminar and intraspinous titanium braided wire tween C1 and C2. We secured the graft in place with a wire and tightened appropriately. We then placed bone morphogenic protein-soaked collagen sponges the contact points between the C1 and C2 lamina as well as the graft. This completed the fusion.  We then obtained hemostasis using bipolar cautery. We removed the retractors and reapproximated patient's cervical thoracic fascia with interrupted #1 Vicryl suture. We reapproximated the subcutaneous tissue with interrupted 2-0 Vicryl suture. We reapproximated the skin with Steri-Strips and benzoin. The wound was then coated with bacitracin ointment. A sterile dressing was applied. The drapes were removed. The patient was subsequent return to the supine position. I then removed the Mayfield 3 point head rest from her calvarium. By report all sponge, instrument and needle counts were correct at the end of this case.

## 2014-07-20 NOTE — Progress Notes (Signed)
Orthopedic Tech Progress Note Patient Details:  Wendy ReeveShelia Monroe 1971-05-20 [id number] Called in order to [facility]. Patient ID: Wendy Monroe, female   DOB: 1971-05-20, 43 y.o.   MRN: [id number]   Lesle ChrisGilliland, Calen Geister L 07/20/2014, 2:06 PM

## 2014-07-20 NOTE — Progress Notes (Signed)
Patient ID: Wendy ReeveShelia Monroe, female   DOB: 1970/10/09, 43 y.o.   MRN: [id number] Subjective:  The patient is alert and pleasant. She looks well. Her neck is appropriately sore. She is not having any radicular symptoms.  Objective: Vital signs in last 24 hours: Temp:  [98 F (36.7 C)-99.4 F (37.4 C)] 99 F (37.2 C) (12/10 0427) Pulse Rate:  [87-97] 95 (12/10 0427) Resp:  [11-20] 20 (12/10 0427) BP: (99-127)/(55-99) 99/62 mmHg (12/10 0427) SpO2:  [94 %-98 %] 94 % (12/10 0427)  Intake/Output from previous day: 12/09 0701 - 12/10 0700 In: 2280 [P.O.:480; I.V.:1800] Out: 1400 [Urine:1050; Emesis/NG output:200; Blood:150] Intake/Output this shift:    Physical exam the patient is alert and oriented 3. Her strength is normal. Her dressing is clean and dry.  Lab Results: No results for input(s): WBC, HGB, HCT, PLT in the last 72 hours. BMET No results for input(s): NA, K, CL, CO2, GLUCOSE, BUN, CREATININE, CALCIUM in the last 72 hours.  Studies/Results: Dg Cervical Spine 1 View  07/19/2014   CLINICAL DATA:  Cervical fusion.  EXAM: CERVICAL SPINE  4+ VIEWS  COMPARISON:  CT scan 07/17/2014  FINDINGS: Fluoroscopic spot images demonstrate posterior screws at C1 and C2. Good position and alignment without complicating features.  IMPRESSION: C1-2 fusion screws in good position without complicating features.   Electronically Signed   By: Loralie ChampagneMark  Gallerani M.D.   On: 07/19/2014 13:28   Dg C-arm 1-60 Min  07/19/2014   CLINICAL DATA:  Cervical fusion.  EXAM: CERVICAL SPINE  4+ VIEWS  COMPARISON:  CT scan 07/17/2014  FINDINGS: Fluoroscopic spot images demonstrate posterior screws at C1 and C2. Good position and alignment without complicating features.  IMPRESSION: C1-2 fusion screws in good position without complicating features.   Electronically Signed   By: Loralie ChampagneMark  Gallerani M.D.   On: 07/19/2014 13:28    Assessment/Plan: Postop day #1: The patient is doing well. We will mobilize her. She will likely go  home tomorrow.  LOS: 1 day     Mimie Goering D 07/20/2014, 7:49 AM

## 2014-07-21 MED ORDER — OXYCODONE HCL 15 MG PO TABS: 15.0000 mg | ORAL_TABLET | ORAL | Status: DC | PRN

## 2014-07-21 MED ORDER — DIAZEPAM 5 MG PO TABS: 5.0000 mg | ORAL_TABLET | Freq: Four times a day (QID) | ORAL | Status: DC | PRN

## 2014-07-21 MED ORDER — OXYCODONE HCL 5 MG PO TABS: 15.0000 mg | ORAL_TABLET | ORAL | Status: DC | PRN

## 2014-07-21 MED ORDER — DSS 100 MG PO CAPS: 100.0000 mg | ORAL_CAPSULE | Freq: Two times a day (BID) | ORAL | Status: DC

## 2014-07-21 NOTE — Discharge Summary (Signed)
  Physician Discharge Summary  Patient ID: Wendy ReeveShelia Monroe MRN: [id number] DOB/AGE: January 23, 1971 43 y.o.  Admit date: 07/19/2014 Discharge date: 07/21/2014  Admission Diagnoses: Os odontoideum, cervicalgia, cervical instability  Discharge Diagnoses: The same Active Problems:   Os odontoideum   Discharged Condition: good  Hospital Course: I performed a posterior C1/2 instrumentation and fusion on the patient on 07/19/2014. The surgery went well.  The patient's postoperative course was remarkable for some trouble with pain management. By postoperative day #2 the patient wanted to be discharged. She was given oral and written discharge instructions. All her questions were answered.  Consults: None Significant Diagnostic Studies: None Treatments: Posterior C1/2 instrumentation and fusion. Discharge Exam: Blood pressure 116/68, pulse 78, temperature 98.9 F (37.2 C), temperature source Oral, resp. rate 18, last menstrual period 06/26/2014, SpO2 94 %. The patient is alert and pleasant. Her dressing is clean and dry. She is moving all 4 extremity as well.  Disposition: Home     Medication List    ASK your doctor about these medications        albuterol 108 (90 BASE) MCG/ACT inhaler  Commonly known as:  PROVENTIL HFA;VENTOLIN HFA  Inhale 1-2 puffs into the lungs every 6 (six) hours as needed for wheezing or shortness of breath.     ALPRAZolam 0.5 MG tablet  Commonly known as:  XANAX  Take 0.5 mg by mouth every 6 (six) hours as needed for anxiety.     buPROPion 300 MG 24 hr tablet  Commonly known as:  WELLBUTRIN XL  Take 300 mg by mouth daily.     Calcium-Magnesium-Zinc-D3 Tabs  Take 1 tablet by mouth daily.     divalproex 250 MG DR tablet  Commonly known as:  DEPAKOTE  Take 250 mg by mouth 2 (two) times daily.     escitalopram 10 MG tablet  Commonly known as:  LEXAPRO  Take 10 mg by mouth daily.     fexofenadine 180 MG tablet  Commonly known as:  ALLEGRA  Take 180 mg by  mouth daily.     fluticasone 50 MCG/ACT nasal spray  Commonly known as:  FLONASE  Place 1 spray into both nostrils daily.     ibuprofen 800 MG tablet  Commonly known as:  ADVIL,MOTRIN  Take 800 mg by mouth 4 (four) times daily as needed for headache (pain).     lansoprazole 30 MG capsule  Commonly known as:  PREVACID  Take 30 mg by mouth daily at 12 noon.     Melatonin 5 MG Tabs  Take 5 mg by mouth at bedtime.     montelukast 10 MG tablet  Commonly known as:  SINGULAIR  Take 10 mg by mouth at bedtime.     norethindrone 0.35 MG tablet  Commonly known as:  MICRONOR,CAMILA,ERRIN  Take 1 tablet by mouth daily.     traMADol 50 MG tablet  Commonly known as:  ULTRAM  Take 50-100 mg by mouth daily.         SignedCristi Loron: Iya Hamed D 07/21/2014, 7:29 AM

## 2014-07-21 NOTE — Progress Notes (Signed)
Pt and mother given D/C instructions with Rx's, verbal understanding was provided. Pt's incision is covered with gauze and has no sign of infection. Pt's IV was removed prior to D/C. Pt D/C'd home via wheelchair per MD order. Pt is stable @ D/C and has no other needs at this time. Rema FendtAshley Fredrico Beedle, RN

## 2014-07-21 NOTE — Discharge Instructions (Signed)
Call MD for: Difficulty breathing, headache or visual disturbances, extreme fatigue  Call MD for: hives  Call MD for: persistant dizziness or light-headedness  Call MD for: persistant nausea and vomiting  Call MD for: redness, tenderness, or signs of infection (pain, swelling, redness, odor or green/yellow discharge around incision site)  Call MD for: severe uncontrolled pain  Call MD for: temperature >100.4 Diet - low sodium heart healthy Discharge instructions   Call [phone] for a followup appointment.   Increase activity slowly Remove dressing in 48 hours     Cervical Fusion The neck is the upper portion of your spine. The 7 bones in your neck are referred to as the cervical spine. Cervical fusion is a type of surgery that is done on the cervical spine to relieve pressure on the spinal cord or one or more nerve roots. There are two types of cervical fusion:  Anterior cervical fusion. This surgery is done through the front (anterior) part of your neck. During the surgery the affected intervertebral disk is removed to take pressure off the nerves or spinal cord. The area where the disc was removed is filled with a bone graft that causes the vertebral bodies to grow together (fuse) over time.  Posterior cervical fusion. This surgery is done through the back (posterior) of the neck. The surgery joins two or more neck vertebrae into one solid section of bone. Posterior cervical fusion is most commonly used to treat neck fractures and dislocations and to fix deformities in the curve of the neck. LET [facility]OUR [facility] CARE PROVIDER KNOW ABOUT:  Any allergies you have.  All medicines you are taking, including vitamins, herbs, eyedrops, creams, and over-the-counter medicines.  Previous problems you or members of your family have had with the use of anesthetics.  Any blood disorders or blood clotting problems you have.  Previous surgeries you have had.  Medical conditions you have. RISKS AND  COMPLICATIONS Generally, this is a safe procedure. However, as with any procedure, problems can occur. Possible problems include:   Infection.   Bleeding with possible need for blood transfusion.   Injury to surrounding structures, including nerves.   Leakage of cerebrospinal fluid.   Blood clots.  Temporary breathing difficulties after surgery.  Extended hospital stay, especially with posterior cervical fusion. BEFORE THE PROCEDURE  Do not eat or drink for 6-8 hours before the procedure.   Take medicines as directed by your surgeon. Ask your surgeon about changing or stopping your regular medicines.   You will be given antibiotic medicines to keep the infection rate down.   The surgical cut (incision) site on your neck will be marked.   Your neck will be cleaned to reduce the risk of infection. PROCEDURE  The length of the procedure depends on what needs to be done. It usually takes 2 or more hours. For both procedures, you will be given medicine to make you sleep (general anesthetic). A breathing tube will be placed down your throat.  Anterior Cervical Fusion  An incision will usually be made in a skin fold line at the front of your neck, in the area where the fusion will be placed.  The neck muscles will be pushed aside.   The surgeon will remove the affected, degenerated disk and bone spurs (decompression). This helps to take the pressure off the nerves and spinal cord.   The area where the disk was removed is then filled with a plastic spacer implant, bone graft, or both. These implants and bone  grafts take the place of the disk and keep the nerve passageway open and clear for the nerves and spinal cord.   In most cases, the surgeon will put metal plates, pins, or screws (hardware) in the neck to help stabilize the surgical site and to keep the implants and bone grafts in place. The hardware reduces motion at the surgical site, so the bones can grow together.  This provides extra support to the neck.  Posterior Cervical Fusion  An incision will be made through the back of the neck.   Two or more neck vertebrae will be joined into one solid section of bone.   Metal plates and pins or screws may be placed in the neck. These help stabilize the neck, providing extra support to help the bones to grow together more easily. AFTER THE PROCEDURE  You will stay in a recovery area until the anesthesia has worn off. Your blood pressure and pulse will be checked often.   You may continue to receive fluids and medicines, such as antibiotics, through the IV tube for several days after the surgery.   You may need to wear a neck or back brace for several weeks after surgery, especially when up and out of bed.   You may be given pain medicine while still in the recovery area. Some pain is normal, but if your pain gets worse, tell your surgeon or nurse.   Be up and moving as soon as possible after surgery. Physical therapists will help you start walking.   To prevent blood clots in your legs:  You may be given compression stockings to wear.   You may need to take medicine to prevent clots.  You may be asked to do breathing exercises. This is to prevent a lung infection.   Most people stay in the hospital for 1-3 days after this surgery.  Document Released: 01/17/2002 Document Revised: 08/02/2013 Document Reviewed: 01/27/2013 [facility] Patient Information 2015 [employer] [place]. This information is not intended to replace advice given to you by your health care provider. Make sure you discuss any questions you have with your health care provider.

## 2014-07-25 ENCOUNTER — Encounter (HOSPITAL_COMMUNITY): Payer: Self-pay | Admitting: Neurosurgery

## 2015-11-05 ENCOUNTER — Other Ambulatory Visit: Payer: Self-pay | Admitting: Allergy and Immunology

## 2016-03-09 IMAGING — CT CT CERVICAL SPINE W/O CM
4 of 5 series · 10 of 20 positions shown, 11 images · non-contrast
Comparison: 05/25/2014 and 05/23/2014 plain film exam. No
comparison MR.

CLINICAL DATA: 43-year-old female with right hand numbness and
tingling for 3 months. No history of trauma or cancer. Preoperative
exam. Subsequent encounter.

EXAM:
CT CERVICAL SPINE WITHOUT CONTRAST
TECHNIQUE: Multidetector CT imaging of the cervical spine was performed without
intravenous contrast. Multiplanar CT image reconstructions were also
generated.

[Series 2: c spine bone · axial · 0.27mm/px · z∈[+106,+166]mm · 2 of 73 slices shown]
[im 25/73  bone]
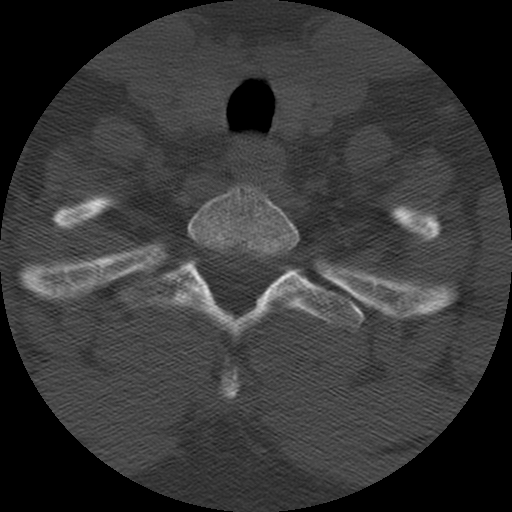
[im 49/73  bone]
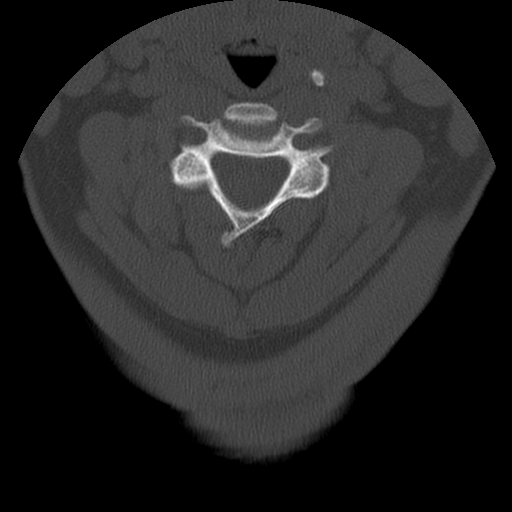

[Series 3: c spine soft · axial · 0.27mm/px · z∈[+106,+166]mm · 2 of 73 slices shown]
[im 25/73  soft-tissue]
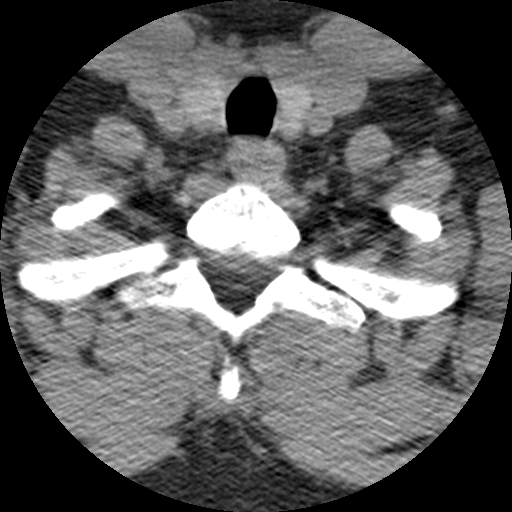
[im 49/73  soft-tissue]
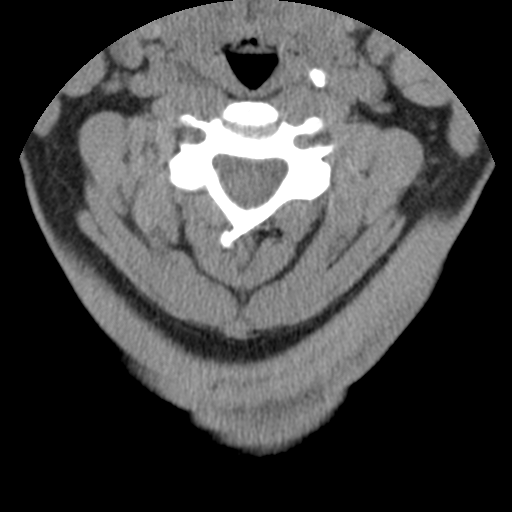

[Series 401: sag · sagittal · 0.36mm/px · 2 of 56 slices shown]
[im 19/56  bone]
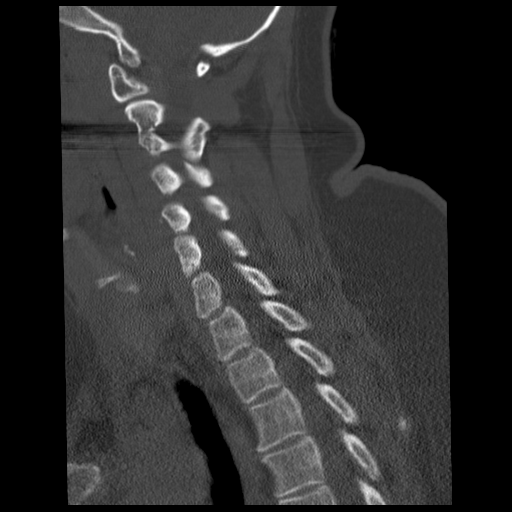
[im 37/56  bone]
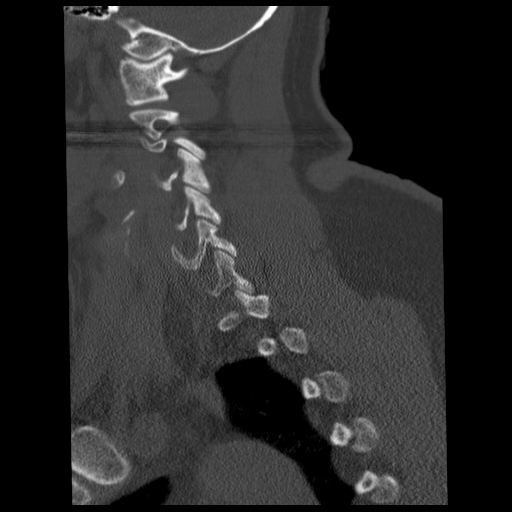

[Series 402: axial · axial · 0.23mm/px · z∈[+49,+171]mm · 4 of 113 slices shown, 5 images]
[im 23/113  soft-tissue]
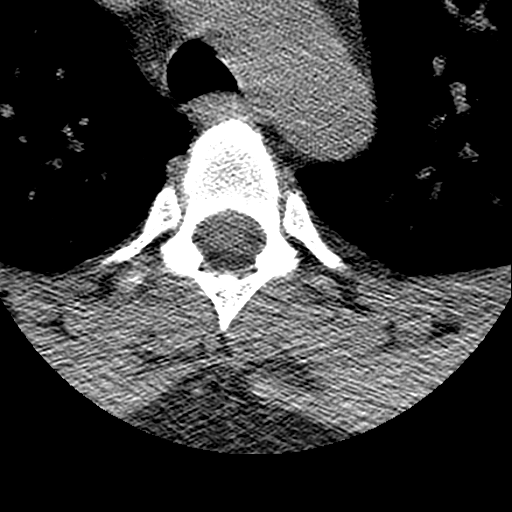
[im 23/113  bone]
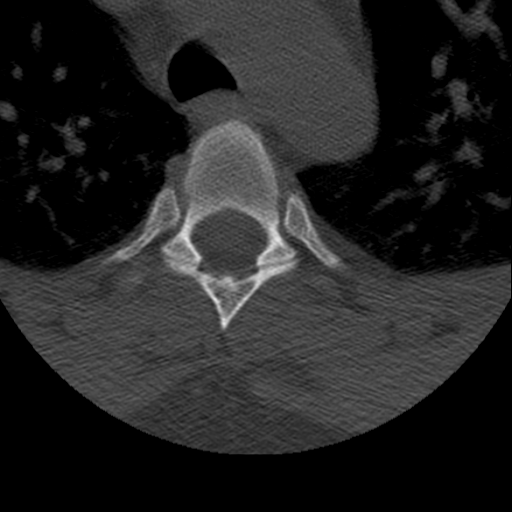
[im 45/113  bone]
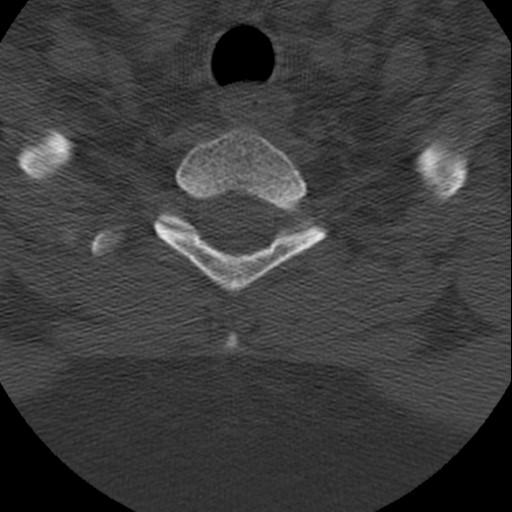
[im 68/113  bone]
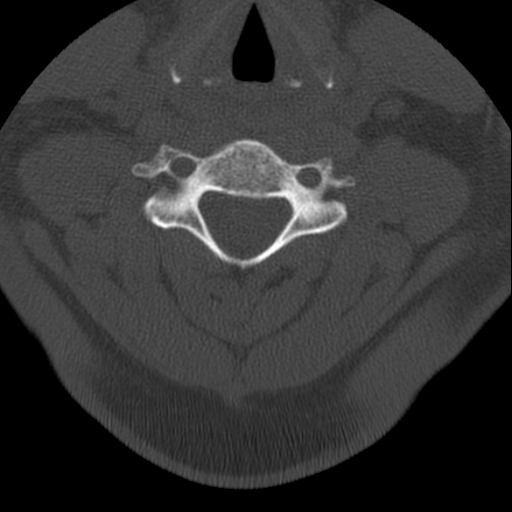
[im 90/113  bone]
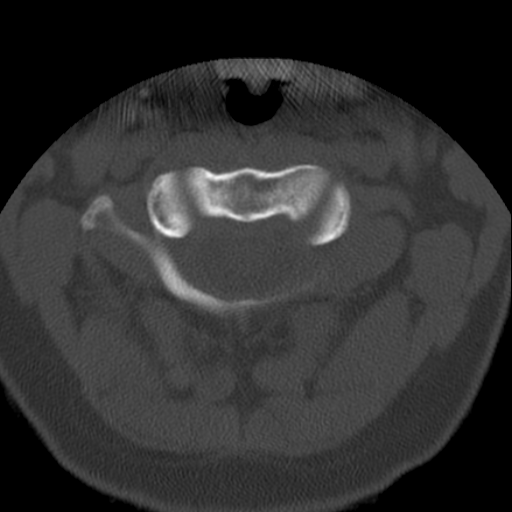

[10 of 20 positions shown; findings below may reference images not displayed]

FINDINGS: Exam is slightly limited by patient's habitus.

Present examination includes from the clivus to the upper T5 level.

Congenital fusion of the C2 and C3 vertebra. There is an os
odontoideum. Transverse ligament hypertrophy. Spinal stenosis with
mild cord flattening.

No other a osseous abnormality is noted.

C3-4: Minimal bulge.

C4-5:  Minimal to mild bulge greater to left.

C5-6: Mild bulge greater to left.

C6-7: Negative.  Evaluation limited by habitus.

C7-T1:  Negative.  Evaluation limited by habitus.

Visualized paravertebral structures and lung apices are
unremarkable.
IMPRESSION: Exam is slightly limited by patient's habitus.

Congenital fusion of the C2 and C3 vertebra. There is an os
odontoideum (separated from the rest of C2 by 3 mm) Transverse
ligament hypertrophy. Spinal stenosis with mild cord flattening.

No other a osseous abnormality is noted.

C3-4 minimal bulge.

C4-5 minimal to mild bulge greater to left.

C5-6 mild bulge greater to left.

## 2016-03-11 IMAGING — RF DG CERVICAL SPINE 1V
1 series · 1 of 1 positions shown · non-contrast
Comparison: CT scan 07/17/2014

CLINICAL DATA: Cervical fusion.

EXAM:
CERVICAL SPINE  4+ VIEWS

[Series 1: run · 1 of 1 slices shown]
[im 1/1]
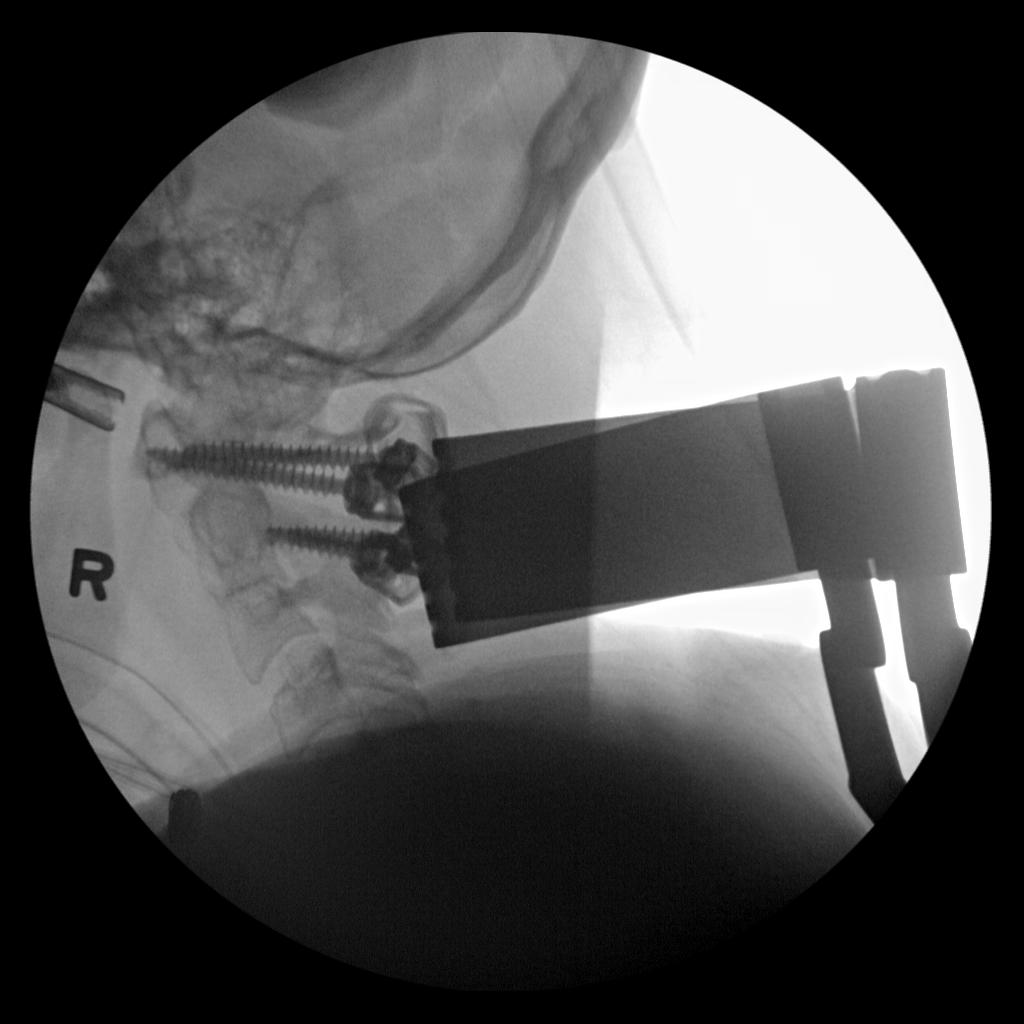

[1 of 1 positions shown; findings below may reference images not displayed]

FINDINGS: Fluoroscopic spot images demonstrate posterior screws at C1 and C2.
Good position and alignment without complicating features.
IMPRESSION: C1-2 fusion screws in good position without complicating features.

## 2016-11-17 DIAGNOSIS — F3342 Major depressive disorder, recurrent, in full remission: Secondary | ICD-10-CM | POA: Diagnosis not present

## 2016-12-04 ENCOUNTER — Ambulatory Visit (INDEPENDENT_AMBULATORY_CARE_PROVIDER_SITE_OTHER): Payer: Self-pay | Admitting: Allergy and Immunology

## 2016-12-04 ENCOUNTER — Encounter: Payer: Self-pay | Admitting: Allergy and Immunology

## 2016-12-04 VITALS — BP 132/84 | HR 72 | Temp 98.6°F | Resp 16 | Ht 61.69 in | Wt 195.4 lb

## 2016-12-04 DIAGNOSIS — J019 Acute sinusitis, unspecified: Secondary | ICD-10-CM | POA: Diagnosis not present

## 2016-12-04 DIAGNOSIS — J3089 Other allergic rhinitis: Secondary | ICD-10-CM

## 2016-12-04 DIAGNOSIS — J4521 Mild intermittent asthma with (acute) exacerbation: Secondary | ICD-10-CM

## 2016-12-04 MED ORDER — ALBUTEROL SULFATE HFA 108 (90 BASE) MCG/ACT IN AERS: INHALATION_SPRAY | RESPIRATORY_TRACT | 1 refills | Status: DC

## 2016-12-04 MED ORDER — MONTELUKAST SODIUM 10 MG PO TABS: 10.0000 mg | ORAL_TABLET | Freq: Every day | ORAL | 11 refills | Status: DC

## 2016-12-04 MED ORDER — METHYLPREDNISOLONE ACETATE 80 MG/ML IJ SUSP
80.0000 mg | Freq: Once | INTRAMUSCULAR | Status: AC
  Administered 2016-12-04: 80 mg via INTRAMUSCULAR

## 2016-12-04 MED ORDER — CEFUROXIME AXETIL 250 MG PO TABS: ORAL_TABLET | ORAL | 0 refills | Status: DC

## 2016-12-04 MED ORDER — BUDESONIDE-FORMOTEROL FUMARATE 160-4.5 MCG/ACT IN AERO: INHALATION_SPRAY | RESPIRATORY_TRACT | 11 refills | Status: DC

## 2016-12-04 NOTE — Progress Notes (Signed)
Follow-up Note  Referring Provider: Abner Greenspan, MD Primary Provider: Abner Greenspan, MD Date of Office Visit: 12/04/2016  Subjective:   Wendy Monroe (DOB: 1971/01/08) is a 46 y.o. female [facility] in re-evaluation of the following:  HPI: [name] presents to this clinic in evaluation of respiratory tract symptoms. I had last seen her in this clinic for asthma and allergic rhinitis and LPR in June 2016.  She stopped all of her medications regarding respiratory tract inflammation and thought that she was doing relatively well but unfortunately one week ago she developed cough and nasal congestion and laryngitis and a bad headache in her face and ugly green nasal discharge and ugly green postnasal drip that has been progressive. Prior to this point time it does not sound as though she has required an antibiotic or steroid to treat her respiratory tract issue.  Allergies as of 12/04/2016      Reactions   Erythromycin Other (See Comments)   "Pain worse than labor"   Hctz [hydrochlorothiazide] Itching   Penicillins Hives   Prednisone Other (See Comments)   High doses of prednisone:"make me crazy"   Sulfa Antibiotics Hives   Vivactil [protriptyline Hcl]       Medication List      atenolol 100 MG tablet Commonly known as:  TENORMIN Take 100 mg by mouth daily.   busPIRone 15 MG tablet Commonly known as:  BUSPAR Takes 2 tablets in the morning and 1 tablet in the evening.   lithium carbonate 300 MG capsule Take 1 capsule by mouth 2 (two) times daily.   metFORMIN 500 MG 24 hr tablet Commonly known as:  GLUCOPHAGE-XR Take 1 tablet by mouth daily.   pantoprazole 40 MG tablet Commonly known as:  PROTONIX Take 1 tablet by mouth every morning.   venlafaxine XR 150 MG 24 hr capsule Commonly known as:  EFFEXOR-XR Take 1 capsule by mouth daily.       Past Medical History:  Diagnosis Date  . Anxiety   . Asthma   . Chronic neck  pain   . Depression   . Diabetes (HCC)   . Family history of adverse reaction to anesthesia    Mother had PONV  . GERD (gastroesophageal reflux disease)   . Headache    Migraines  . Numbness and tingling of right arm   . PONV (postoperative nausea and vomiting)   . Wears glasses     Past Surgical History:  Procedure Laterality Date  . POSTERIOR CERVICAL FUSION/FORAMINOTOMY N/A 07/19/2014   Procedure: POSTERIOR CERVICAL FUSION/FORAMINOTOMY CERVICAL ONE-TWO WITH INSTRUMENTATION;  Surgeon: Tressie Stalker, MD;  Location: [facility];  Service: Neurosurgery;  Laterality: N/A;  . WISDOM TOOTH EXTRACTION      Review of systems negative except as noted in HPI / PMHx or noted below:  Review of Systems  Constitutional: Negative.   HENT: Negative.   Eyes: Negative.   Respiratory: Negative.   Cardiovascular: Negative.   Gastrointestinal: Negative.   Genitourinary: Negative.   Musculoskeletal: Negative.   Skin: Negative.   Neurological: Negative.   Endo/Heme/Allergies: Negative.   Psychiatric/Behavioral: Negative.      Objective:   Vitals:   12/04/16 1529  BP: 132/84  Pulse: 72  Resp: 16  Temp: 98.6 F (37 C)   Height: 5' 1.69" (156.7 cm)  Weight: 195 lb 6.4 oz (88.6 kg)   Physical Exam  Constitutional: She is well-developed, well-nourished, and in no distress.  Raspy  nasal voice, coughing  HENT:  Head: Normocephalic.  Right Ear: Tympanic membrane, external ear and ear canal normal.  Left Ear: Tympanic membrane, external ear and ear canal normal.  Nose: Mucosal edema present. No rhinorrhea.  Mouth/Throat: Uvula is midline, oropharynx is clear and moist and mucous membranes are normal. No oropharyngeal exudate.  Eyes: Conjunctivae are normal.  Neck: Trachea normal. No tracheal tenderness present. No tracheal deviation present. No thyromegaly present.  Cardiovascular: Normal rate, regular rhythm, S1 normal, S2 normal and normal heart sounds.   No murmur  heard. Pulmonary/Chest: No stridor. No respiratory distress. She has wheezes (end expiratory wheezing posterior lung field). She has no rales.  Musculoskeletal: She exhibits no edema.  Lymphadenopathy:       Head (right side): No tonsillar adenopathy present.       Head (left side): No tonsillar adenopathy present.    She has no cervical adenopathy.  Neurological: She is alert. Gait normal.  Skin: No rash noted. She is not diaphoretic. No erythema. Nails show no clubbing.  Psychiatric: Mood and affect normal.    Diagnostics:    Spirometry was performed and demonstrated an FEV1 of 2.06 at 76 % of predicted.  Assessment and Plan:   1. Asthma, not well controlled, mild intermittent, with acute exacerbation   2. Other allergic rhinitis   3. Acute sinusitis, recurrence not specified, unspecified location     1. Depo-Medrol 80 IM delivered in clinic today  2. Ceftin 250 one tablet twice a day for the next 10 days  3. OTC Nasacort 1 spray each nostril twice a day  4. Montelukast 10 mg one tablet one time per day  5. Symbicort 160 - 2 inhalations twice a day  6. If needed:   A. nasal saline  B. OTC antihistamine  C. OTC Mucinex DM  D. Proventil HFA or similar 2 inhalations every 4-6 hours  7. Further evaluation and treatment?  8. Return to clinic in 1 year or earlier if problem  I will assume that she will do well with a combination of anti-inflammatory therapy for her respiratory tract inflammation and a broad-spectrum antibiotic to treat what appears to be an episode of sinusitis. I suspect that she will stop all of her medications once she is better as this tends to be her mode of operation once her acute symptoms have resolved. Overall she's really done quite well without many medications involving respiratory tract inflammation and we'll see how things go over the course the next several months to year. I've asked her to contact me should she develop significant problems in  the next several months to year but otherwise she can return to this clinic in 1 year or earlier if there is a problem.  Laurette Schimke, MD Allergy / Immunology [facility]

## 2016-12-04 NOTE — Patient Instructions (Addendum)
  1. Depo-Medrol 80 IM delivered in clinic today  2. Ceftin 250 one tablet twice a day for the next 10 days  3. OTC Nasacort 1 spray each nostril twice a day  4. Montelukast 10 mg one tablet one time per day  5. Symbicort 160 - 2 inhalations twice a day  6. If needed:   A. nasal saline  B. OTC antihistamine  C. OTC Mucinex DM  D. Proventil HFA or similar 2 inhalations every 4-6 hours  7. Further evaluation and treatment?  8. Return to clinic in 1 year or earlier if problem

## 2016-12-23 DIAGNOSIS — K219 Gastro-esophageal reflux disease without esophagitis: Secondary | ICD-10-CM | POA: Diagnosis not present

## 2016-12-23 DIAGNOSIS — A084 Viral intestinal infection, unspecified: Secondary | ICD-10-CM | POA: Diagnosis not present

## 2017-01-13 DIAGNOSIS — J309 Allergic rhinitis, unspecified: Secondary | ICD-10-CM | POA: Diagnosis not present

## 2017-01-13 DIAGNOSIS — E8881 Metabolic syndrome: Secondary | ICD-10-CM | POA: Diagnosis not present

## 2017-01-13 DIAGNOSIS — F411 Generalized anxiety disorder: Secondary | ICD-10-CM | POA: Diagnosis not present

## 2017-01-13 DIAGNOSIS — G43701 Chronic migraine without aura, not intractable, with status migrainosus: Secondary | ICD-10-CM | POA: Diagnosis not present

## 2017-02-16 DIAGNOSIS — F330 Major depressive disorder, recurrent, mild: Secondary | ICD-10-CM | POA: Diagnosis not present

## 2017-02-18 DIAGNOSIS — R112 Nausea with vomiting, unspecified: Secondary | ICD-10-CM | POA: Diagnosis not present

## 2017-04-01 DIAGNOSIS — F3341 Major depressive disorder, recurrent, in partial remission: Secondary | ICD-10-CM | POA: Diagnosis not present

## 2017-04-29 DIAGNOSIS — H5213 Myopia, bilateral: Secondary | ICD-10-CM | POA: Diagnosis not present

## 2017-04-29 DIAGNOSIS — E119 Type 2 diabetes mellitus without complications: Secondary | ICD-10-CM | POA: Diagnosis not present

## 2017-05-13 DIAGNOSIS — R5381 Other malaise: Secondary | ICD-10-CM | POA: Diagnosis not present

## 2017-05-13 DIAGNOSIS — Z79899 Other long term (current) drug therapy: Secondary | ICD-10-CM | POA: Diagnosis not present

## 2017-05-13 DIAGNOSIS — Z30011 Encounter for initial prescription of contraceptive pills: Secondary | ICD-10-CM | POA: Diagnosis not present

## 2017-05-13 DIAGNOSIS — Z23 Encounter for immunization: Secondary | ICD-10-CM | POA: Diagnosis not present

## 2017-05-13 DIAGNOSIS — K219 Gastro-esophageal reflux disease without esophagitis: Secondary | ICD-10-CM | POA: Diagnosis not present

## 2017-05-13 DIAGNOSIS — G43701 Chronic migraine without aura, not intractable, with status migrainosus: Secondary | ICD-10-CM | POA: Diagnosis not present

## 2017-05-27 DIAGNOSIS — F3342 Major depressive disorder, recurrent, in full remission: Secondary | ICD-10-CM | POA: Diagnosis not present

## 2017-06-12 DIAGNOSIS — E663 Overweight: Secondary | ICD-10-CM | POA: Diagnosis not present

## 2017-06-12 DIAGNOSIS — M545 Low back pain: Secondary | ICD-10-CM | POA: Diagnosis not present

## 2017-06-12 DIAGNOSIS — Z6834 Body mass index (BMI) 34.0-34.9, adult: Secondary | ICD-10-CM | POA: Diagnosis not present

## 2017-07-09 DIAGNOSIS — J019 Acute sinusitis, unspecified: Secondary | ICD-10-CM | POA: Diagnosis not present

## 2017-07-09 DIAGNOSIS — B9689 Other specified bacterial agents as the cause of diseases classified elsewhere: Secondary | ICD-10-CM | POA: Diagnosis not present

## 2017-07-09 DIAGNOSIS — H6506 Acute serous otitis media, recurrent, bilateral: Secondary | ICD-10-CM | POA: Diagnosis not present

## 2017-07-09 DIAGNOSIS — R112 Nausea with vomiting, unspecified: Secondary | ICD-10-CM | POA: Diagnosis not present

## 2017-07-23 DIAGNOSIS — F3342 Major depressive disorder, recurrent, in full remission: Secondary | ICD-10-CM | POA: Diagnosis not present

## 2017-09-15 DIAGNOSIS — M542 Cervicalgia: Secondary | ICD-10-CM | POA: Diagnosis not present

## 2017-09-15 DIAGNOSIS — G43701 Chronic migraine without aura, not intractable, with status migrainosus: Secondary | ICD-10-CM | POA: Diagnosis not present

## 2017-09-15 DIAGNOSIS — G8929 Other chronic pain: Secondary | ICD-10-CM | POA: Diagnosis not present

## 2017-09-15 DIAGNOSIS — E8881 Metabolic syndrome: Secondary | ICD-10-CM | POA: Diagnosis not present

## 2017-09-24 DIAGNOSIS — G43701 Chronic migraine without aura, not intractable, with status migrainosus: Secondary | ICD-10-CM | POA: Diagnosis not present

## 2017-10-08 DIAGNOSIS — F3341 Major depressive disorder, recurrent, in partial remission: Secondary | ICD-10-CM | POA: Diagnosis not present

## 2017-10-15 DIAGNOSIS — F3341 Major depressive disorder, recurrent, in partial remission: Secondary | ICD-10-CM | POA: Diagnosis not present

## 2017-10-20 DIAGNOSIS — E1165 Type 2 diabetes mellitus with hyperglycemia: Secondary | ICD-10-CM | POA: Diagnosis not present

## 2017-10-20 DIAGNOSIS — Z79899 Other long term (current) drug therapy: Secondary | ICD-10-CM | POA: Diagnosis not present

## 2017-10-20 DIAGNOSIS — J0100 Acute maxillary sinusitis, unspecified: Secondary | ICD-10-CM | POA: Diagnosis not present

## 2017-10-20 DIAGNOSIS — R300 Dysuria: Secondary | ICD-10-CM | POA: Diagnosis not present

## 2017-10-29 DIAGNOSIS — F3341 Major depressive disorder, recurrent, in partial remission: Secondary | ICD-10-CM | POA: Diagnosis not present

## 2017-11-06 DIAGNOSIS — M25561 Pain in right knee: Secondary | ICD-10-CM | POA: Diagnosis not present

## 2017-11-06 DIAGNOSIS — T1490XA Injury, unspecified, initial encounter: Secondary | ICD-10-CM | POA: Diagnosis not present

## 2017-11-06 DIAGNOSIS — E039 Hypothyroidism, unspecified: Secondary | ICD-10-CM | POA: Diagnosis not present

## 2017-11-06 DIAGNOSIS — J309 Allergic rhinitis, unspecified: Secondary | ICD-10-CM | POA: Diagnosis not present

## 2017-11-11 DIAGNOSIS — E041 Nontoxic single thyroid nodule: Secondary | ICD-10-CM | POA: Diagnosis not present

## 2017-11-11 DIAGNOSIS — E039 Hypothyroidism, unspecified: Secondary | ICD-10-CM | POA: Diagnosis not present

## 2017-12-11 DIAGNOSIS — E1165 Type 2 diabetes mellitus with hyperglycemia: Secondary | ICD-10-CM | POA: Diagnosis not present

## 2017-12-11 DIAGNOSIS — K047 Periapical abscess without sinus: Secondary | ICD-10-CM | POA: Diagnosis not present

## 2017-12-11 DIAGNOSIS — Z6833 Body mass index (BMI) 33.0-33.9, adult: Secondary | ICD-10-CM | POA: Diagnosis not present

## 2017-12-11 DIAGNOSIS — B373 Candidiasis of vulva and vagina: Secondary | ICD-10-CM | POA: Diagnosis not present

## 2018-01-15 DIAGNOSIS — E039 Hypothyroidism, unspecified: Secondary | ICD-10-CM | POA: Diagnosis not present

## 2018-01-15 DIAGNOSIS — R232 Flushing: Secondary | ICD-10-CM | POA: Diagnosis not present

## 2018-01-15 DIAGNOSIS — E1165 Type 2 diabetes mellitus with hyperglycemia: Secondary | ICD-10-CM | POA: Diagnosis not present

## 2018-01-15 DIAGNOSIS — J309 Allergic rhinitis, unspecified: Secondary | ICD-10-CM | POA: Diagnosis not present

## 2018-01-15 DIAGNOSIS — Z79899 Other long term (current) drug therapy: Secondary | ICD-10-CM | POA: Diagnosis not present

## 2018-01-15 DIAGNOSIS — J4530 Mild persistent asthma, uncomplicated: Secondary | ICD-10-CM | POA: Diagnosis not present

## 2018-02-22 DIAGNOSIS — Z6833 Body mass index (BMI) 33.0-33.9, adult: Secondary | ICD-10-CM | POA: Diagnosis not present

## 2018-02-22 DIAGNOSIS — L259 Unspecified contact dermatitis, unspecified cause: Secondary | ICD-10-CM | POA: Diagnosis not present

## 2018-02-22 DIAGNOSIS — B373 Candidiasis of vulva and vagina: Secondary | ICD-10-CM | POA: Diagnosis not present

## 2018-03-24 DIAGNOSIS — G43701 Chronic migraine without aura, not intractable, with status migrainosus: Secondary | ICD-10-CM | POA: Diagnosis not present

## 2018-03-24 DIAGNOSIS — E039 Hypothyroidism, unspecified: Secondary | ICD-10-CM | POA: Diagnosis not present

## 2018-03-24 DIAGNOSIS — S99911A Unspecified injury of right ankle, initial encounter: Secondary | ICD-10-CM | POA: Diagnosis not present

## 2018-03-24 DIAGNOSIS — E1165 Type 2 diabetes mellitus with hyperglycemia: Secondary | ICD-10-CM | POA: Diagnosis not present

## 2018-04-15 DIAGNOSIS — G43701 Chronic migraine without aura, not intractable, with status migrainosus: Secondary | ICD-10-CM | POA: Diagnosis not present

## 2018-04-15 DIAGNOSIS — E1165 Type 2 diabetes mellitus with hyperglycemia: Secondary | ICD-10-CM | POA: Diagnosis not present

## 2018-04-15 DIAGNOSIS — E039 Hypothyroidism, unspecified: Secondary | ICD-10-CM | POA: Diagnosis not present

## 2018-04-15 DIAGNOSIS — R232 Flushing: Secondary | ICD-10-CM | POA: Diagnosis not present

## 2018-04-21 DIAGNOSIS — E1165 Type 2 diabetes mellitus with hyperglycemia: Secondary | ICD-10-CM | POA: Diagnosis not present

## 2018-05-04 DIAGNOSIS — E1165 Type 2 diabetes mellitus with hyperglycemia: Secondary | ICD-10-CM | POA: Diagnosis not present

## 2018-05-17 DIAGNOSIS — Z6835 Body mass index (BMI) 35.0-35.9, adult: Secondary | ICD-10-CM | POA: Diagnosis not present

## 2018-05-17 DIAGNOSIS — H01009 Unspecified blepharitis unspecified eye, unspecified eyelid: Secondary | ICD-10-CM | POA: Diagnosis not present

## 2018-06-01 DIAGNOSIS — E1165 Type 2 diabetes mellitus with hyperglycemia: Secondary | ICD-10-CM | POA: Diagnosis not present

## 2018-06-01 DIAGNOSIS — G43701 Chronic migraine without aura, not intractable, with status migrainosus: Secondary | ICD-10-CM | POA: Diagnosis not present

## 2018-06-01 DIAGNOSIS — Z23 Encounter for immunization: Secondary | ICD-10-CM | POA: Diagnosis not present

## 2018-06-01 DIAGNOSIS — N921 Excessive and frequent menstruation with irregular cycle: Secondary | ICD-10-CM | POA: Diagnosis not present

## 2018-06-08 DIAGNOSIS — Z6834 Body mass index (BMI) 34.0-34.9, adult: Secondary | ICD-10-CM | POA: Diagnosis not present

## 2018-06-08 DIAGNOSIS — K219 Gastro-esophageal reflux disease without esophagitis: Secondary | ICD-10-CM | POA: Diagnosis not present

## 2018-06-08 DIAGNOSIS — N921 Excessive and frequent menstruation with irregular cycle: Secondary | ICD-10-CM | POA: Diagnosis not present

## 2018-06-09 DIAGNOSIS — N858 Other specified noninflammatory disorders of uterus: Secondary | ICD-10-CM | POA: Diagnosis not present

## 2018-06-10 ENCOUNTER — Ambulatory Visit: Payer: BLUE CROSS/BLUE SHIELD | Admitting: Neurology

## 2018-06-10 ENCOUNTER — Encounter: Payer: Self-pay | Admitting: Neurology

## 2018-06-10 VITALS — BP 134/85 | HR 99 | Ht 62.0 in | Wt 190.0 lb

## 2018-06-10 DIAGNOSIS — IMO0002 Reserved for concepts with insufficient information to code with codable children: Secondary | ICD-10-CM | POA: Insufficient documentation

## 2018-06-10 DIAGNOSIS — G43709 Chronic migraine without aura, not intractable, without status migrainosus: Secondary | ICD-10-CM | POA: Diagnosis not present

## 2018-06-10 MED ORDER — ONDANSETRON 4 MG PO TBDP: 4.0000 mg | ORAL_TABLET | Freq: Three times a day (TID) | ORAL | 6 refills | Status: DC | PRN

## 2018-06-10 MED ORDER — TIZANIDINE HCL 2 MG PO TABS: 2.0000 mg | ORAL_TABLET | Freq: Three times a day (TID) | ORAL | 5 refills | Status: DC | PRN

## 2018-06-10 MED ORDER — RIZATRIPTAN BENZOATE 10 MG PO TBDP: 10.0000 mg | ORAL_TABLET | ORAL | 6 refills | Status: DC | PRN

## 2018-06-10 MED ORDER — ERENUMAB-AOOE 70 MG/ML ~~LOC~~ SOAJ: 70.0000 mg | SUBCUTANEOUS | 11 refills | Status: DC

## 2018-06-10 NOTE — Patient Instructions (Signed)
You may take  Maxalt zofran Tizanidine Aleve

## 2018-06-10 NOTE — Progress Notes (Signed)
PATIENT: Wendy Monroe DOB: 01-Jun-1971  Chief Complaint  Patient presents with  . Migraine    Reports having 20+ headaches per month with 2-3 becoming migraines.  She uses sumatriptan which is sometimes helpful but she does not like the way it makes her feel.  Fiorcet tends to work best.  She is currently on venlafaxine 150mg  BID.  She also just started amitriptyline 75 at bedtime one week ago (she is experiencing dry mouth with this medication).  . PCP    Buckner Malta, MD     HISTORICAL  Wendy Monroe is a 47 year old female, seen in request by her primary care physician Dr. York Grice, Victorino Dike for evaluation of migraine headaches, initial evaluation was on June 10, 2018.  I have reviewed and summarized the referring note from the referring physician.  She had a past medical history of diabetes, depression, posterior cervical fusion in 2015, still complains of frequent neck pain, radiating pain to bilateral shoulder.  She reported history of migraine headaches since high school, her typical migraine are lateralized severe pounding headache with associated light sensitivity, nauseous, alleviated by sleeping in dark quiet room,  Over the years, she had gradual worsening migraine headache, since 2018, she has migraines 3-4 times each week, lasting for few hours, previously she tried Imitrex, Maxalt works better, but complains some throat tightness, recently she was given prescription of Fioricet, also works for her migraine,  For preventive medication, she has tried Topamax, help her headache, but could not tolerate the side effect of numbness tingling mental slowing, Inderal does not help her, was recently started on Elavil, she is already on polypharmacy treatment including Effexor, BuSpar, clonazepam,  REVIEW OF SYSTEMS: Full 14 system review of systems performed and notable only for eye pain, ringing the ears, trouble swallowing, feeling hot, increased thirst, flushing, confusion,  headaches, difficulty swallowing, restless leg, depression anxiety, too much sleep, racing thoughts.  All other review of systems were negative.  ALLERGIES: Allergies  Allergen Reactions  . Erythromycin Other (See Comments)    "Pain worse than labor"  . Hctz [Hydrochlorothiazide] Itching  . Penicillins Hives  . Prednisone Other (See Comments)    High doses of prednisone:"make me crazy"  . Sulfa Antibiotics Hives  . Vivactil [Protriptyline Hcl]     HOME MEDICATIONS: Current Outpatient Medications  Medication Sig Dispense Refill  . amitriptyline (ELAVIL) 75 MG tablet Take 75 mg by mouth at bedtime.    . busPIRone (BUSPAR) 15 MG tablet Takes 2 tablets in the morning and 1 tablet in the evening.  1  . butalbital-acetaminophen-caffeine (FIORICET, ESGIC) 50-325-40 MG tablet as needed.  1  . Cetirizine HCl (ZYRTEC PO) Take 10 mg by mouth as needed.    . clonazePAM (KLONOPIN PO) Take 10 mg by mouth as needed.    . CYCLOBENZAPRINE HCL PO Take 1 tablet by mouth as needed. She is unsure of dosage.  Rarely takes this medication.    . Dapagliflozin Propanediol (FARXIGA PO) Take 1 tablet by mouth daily. She is unsure of the dosage.    . diazepam (VALIUM) 5 MG tablet Take 1 tablet by mouth as needed.  0  . levothyroxine (SYNTHROID, LEVOTHROID) 100 MCG tablet Take 100 mcg by mouth daily.    . metFORMIN (GLUCOPHAGE-XR) 500 MG 24 hr tablet Take 4 tablets by mouth daily.   0  . norethindrone (MICRONOR,CAMILA,ERRIN) 0.35 MG tablet Take 1 tablet by mouth daily.  0  . pantoprazole (PROTONIX) 40 MG tablet Take 1 tablet by  mouth every morning.  2  . TIZANIDINE HCL PO Take 1 tablet by mouth as needed. She is unsure of dose.  Rarely takes this medication.    Marland Kitchen venlafaxine (EFFEXOR) 75 MG tablet Take 75 mg by mouth 2 (two) times daily.      No current facility-administered medications for this visit.     PAST MEDICAL HISTORY: Past Medical History:  Diagnosis Date  . Anxiety   . Asthma   . Chronic  neck pain   . Depression   . Diabetes (HCC)   . Family history of adverse reaction to anesthesia    Mother had PONV  . GERD (gastroesophageal reflux disease)   . Headache    Migraines  . Migraine   . Numbness and tingling of right arm   . PONV (postoperative nausea and vomiting)   . Thyroid disease   . Wears glasses     PAST SURGICAL HISTORY: Past Surgical History:  Procedure Laterality Date  . POSTERIOR CERVICAL FUSION/FORAMINOTOMY N/A 07/19/2014   Procedure: POSTERIOR CERVICAL FUSION/FORAMINOTOMY CERVICAL ONE-TWO WITH INSTRUMENTATION;  Surgeon: Tressie Stalker, MD;  Location: [facility];  Service: Neurosurgery;  Laterality: N/A;  . WISDOM TOOTH EXTRACTION      FAMILY HISTORY: Family History  Problem Relation Age of Onset  . Asthma Mother   . Thyroid disease Mother   . High blood pressure Mother   . Cancer - Lung Father   . COPD Sister   . Hypercholesterolemia Maternal Grandfather   . Lung cancer Maternal Grandfather   . Diabetes Paternal Grandmother     SOCIAL HISTORY: Social History   Socioeconomic History  . Marital status: Single    Spouse name: Not on file  . Number of children: 1  . Years of education: some college  . Highest education level: Not on file  Occupational History  . Occupation: own business     Comment: skin care products  . Occupation: own business    Comment: vending (snacks, drinks)  Social Needs  . Financial resource strain: Not on file  . Food insecurity:    Worry: Not on file    Inability: Not on file  . Transportation needs:    Medical: Not on file    Non-medical: Not on file  Tobacco Use  . Smoking status: Never Smoker  . Smokeless tobacco: Never Used  Substance and Sexual Activity  . Alcohol use: No  . Drug use: No  . Sexual activity: Not on file  Lifestyle  . Physical activity:    Days per week: Not on file    Minutes per session: Not on file  . Stress: Not on file  Relationships  . Social connections:    Talks on  phone: Not on file    Gets together: Not on file    Attends religious service: Not on file    Active member of club or organization: Not on file    Attends meetings of clubs or organizations: Not on file    Relationship status: Not on file  . Intimate partner violence:    Fear of current or ex partner: Not on file    Emotionally abused: Not on file    Physically abused: Not on file    Forced sexual activity: Not on file  Other Topics Concern  . Not on file  Social History Narrative   Lives at home with her fiance.   Right-handed.   No caffeine use.     PHYSICAL EXAM  Vitals:   06/10/18 0959  BP: 134/85  Pulse: 99  Weight: 190 lb (86.2 kg)  Height: 5\' 2"  (1.575 m)    Not recorded      Body mass index is 34.75 kg/m.  PHYSICAL EXAMNIATION:  Gen: NAD, conversant, well nourised, obese, well groomed                     Cardiovascular: Regular rate rhythm, no peripheral edema, warm, nontender. Eyes: Conjunctivae clear without exudates or hemorrhage Neck: Supple, no carotid bruits. Pulmonary: Clear to auscultation bilaterally   NEUROLOGICAL EXAM:  MENTAL STATUS: Speech:    Speech is normal; fluent and spontaneous with normal comprehension.  Cognition:     Orientation to time, place and person     Normal recent and remote memory     Normal Attention span and concentration     Normal Language, naming, repeating,spontaneous speech     Fund of knowledge   CRANIAL NERVES: CN II: Visual fields are full to confrontation. Fundoscopic exam is normal with sharp discs and no vascular changes. Pupils are round equal and briskly reactive to light. CN III, IV, VI: extraocular movement are normal. No ptosis. CN V: Facial sensation is intact to pinprick in all 3 divisions bilaterally. Corneal responses are intact.  CN VII: Face is symmetric with normal eye closure and smile. CN VIII: Hearing is normal to rubbing fingers CN IX, X: Palate elevates symmetrically. Phonation is  normal. CN XI: Head turning and shoulder shrug are intact CN XII: Tongue is midline with normal movements and no atrophy.  MOTOR: There is no pronator drift of out-stretched arms. Muscle bulk and tone are normal. Muscle strength is normal.  REFLEXES: Reflexes are 2+ and symmetric at the biceps, triceps, knees, and ankles. Plantar responses are flexor.  SENSORY: Intact to light touch, pinprick, positional sensation and vibratory sensation are intact in fingers and toes.  COORDINATION: Rapid alternating movements and fine finger movements are intact. There is no dysmetria on finger-to-nose and heel-knee-shin.    GAIT/STANCE: Posture is normal. Gait is steady with normal steps, base, arm swing, and turning. Heel and toe walking are normal. Tandem gait is normal.  Romberg is absent.   DIAGNOSTIC DATA (LABS, IMAGING, TESTING) - I reviewed patient records, labs, notes, testing and imaging myself where available.   ASSESSMENT AND PLAN  Wendy Monroe is a 47 y.o. female   Chronic migraine headaches  Already on polypharmacy treatment including Effexor, recently added on Elavil, also on BuSpar, clonazepam,  Previously tried and failed Topamax, Inderal,  Add on Aimovig 70 mg once every month as preventive medication  Maxalt as needed, for severe protracted migraine may combine it together with tizanidine, Zofran, Aleve,  Return to clinic in 3 months with nurse practitioner   Levert Feinstein, M.D. Ph.D.  [facility] [address] [place], [place] [address] Ph: [phone] Fax: [phone]  CC: Buckner Malta, MD

## 2018-06-11 ENCOUNTER — Telehealth: Payer: Self-pay | Admitting: *Deleted

## 2018-06-11 DIAGNOSIS — N921 Excessive and frequent menstruation with irregular cycle: Secondary | ICD-10-CM | POA: Diagnosis not present

## 2018-06-11 NOTE — Telephone Encounter (Signed)
Initiated through covermymeds.  PA approved by [employer] [phone]) - UJ#[id number].  Approval valid through 09/08/2018.

## 2018-06-22 DIAGNOSIS — G43701 Chronic migraine without aura, not intractable, with status migrainosus: Secondary | ICD-10-CM | POA: Diagnosis not present

## 2018-06-22 DIAGNOSIS — Z6834 Body mass index (BMI) 34.0-34.9, adult: Secondary | ICD-10-CM | POA: Diagnosis not present

## 2018-06-22 DIAGNOSIS — E8881 Metabolic syndrome: Secondary | ICD-10-CM | POA: Diagnosis not present

## 2018-06-22 DIAGNOSIS — N921 Excessive and frequent menstruation with irregular cycle: Secondary | ICD-10-CM | POA: Diagnosis not present

## 2018-08-30 ENCOUNTER — Telehealth: Payer: Self-pay | Admitting: *Deleted

## 2018-08-30 NOTE — Telephone Encounter (Signed)
PA for continuation of Aimovig received from [facility].  Case completed via covermymeds (key: [id number]).  Decision pending.

## 2018-09-01 NOTE — Telephone Encounter (Signed)
PA approved by [employer] [phone]) - [facility]#[id number]. Approval valid through 08/19/2019.

## 2018-09-20 NOTE — Progress Notes (Deleted)
[facility]  PATIENT: Wendy ReeveShelia Monroe DOB: [birth year]   REASON FOR VISIT: Follow-up for migraine HISTORY FROM:    HISTORY OF PRESENT ILLNESS:  06/10/18 Wendy BridgeYYShelia Monroe is a 48 year old female, seen in request by her primary care physician Dr. Buckner MaltaBurgart, Jennifer for evaluation of migraine headaches, initial evaluation was on June 10, 2018.  I have reviewed and summarized the referring note from the referring physician.  She had a past medical history of diabetes, depression, posterior cervical fusion in 2015, still complains of frequent neck pain, radiating pain to bilateral shoulder.  She reported history of migraine headaches since high school, her typical migraine are lateralized severe pounding headache with associated light sensitivity, nauseous, alleviated by sleeping in dark quiet room,  Over the years, she had gradual worsening migraine headache, since 2018, she has migraines 3-4 times each week, lasting for few hours, previously she tried Imitrex, Maxalt works better, but complains some throat tightness, recently she was given prescription of Fioricet, also works for her migraine,  For preventive medication, she has tried Topamax, help her headache, but could not tolerate the side effect of numbness tingling mental slowing, Inderal does not help her, was recently started on Elavil, she is already on polypharmacy treatment including Effexor, BuSpar, clonazepam,  REVIEW OF SYSTEMS: Full 14 system review of systems performed and notable only for those listed, all others are neg:  Constitutional: neg  Cardiovascular: neg Ear/Nose/Throat: neg  Skin: neg Eyes: neg Respiratory: neg Gastroitestinal: neg  Hematology/Lymphatic: neg  Endocrine: neg Musculoskeletal:neg Allergy/Immunology: neg Neurological: neg Psychiatric: neg Sleep : neg   ALLERGIES: Allergies  Allergen Reactions  . Erythromycin Other (See Comments)    "Pain worse than labor"  . Hctz  [Hydrochlorothiazide] Itching  . Penicillins Hives  . Prednisone Other (See Comments)    High doses of prednisone:"make me crazy"  . Sulfa Antibiotics Hives  . Vivactil [Protriptyline Hcl]     HOME MEDICATIONS: Outpatient Medications Prior to Visit  Medication Sig Dispense Refill  . amitriptyline (ELAVIL) 75 MG tablet Take 75 mg by mouth at bedtime.    . busPIRone (BUSPAR) 15 MG tablet Takes 2 tablets in the morning and 1 tablet in the evening.  1  . butalbital-acetaminophen-caffeine (FIORICET, ESGIC) 50-325-40 MG tablet as needed.  1  . Cetirizine HCl (ZYRTEC PO) Take 10 mg by mouth as needed.    . clonazePAM (KLONOPIN PO) Take 10 mg by mouth as needed.    . CYCLOBENZAPRINE HCL PO Take 1 tablet by mouth as needed. She is unsure of dosage.  Rarely takes this medication.    . Dapagliflozin Propanediol (FARXIGA PO) Take 1 tablet by mouth daily. She is unsure of the dosage.    . diazepam (VALIUM) 5 MG tablet Take 1 tablet by mouth as needed.  0  . Erenumab-aooe (AIMOVIG) 70 MG/ML SOAJ Inject 70 mg into the skin every 30 (thirty) days. 1 mL 11  . levothyroxine (SYNTHROID, LEVOTHROID) 100 MCG tablet Take 100 mcg by mouth daily.    . metFORMIN (GLUCOPHAGE-XR) 500 MG 24 hr tablet Take 4 tablets by mouth daily.   0  . norethindrone (MICRONOR,CAMILA,ERRIN) 0.35 MG tablet Take 1 tablet by mouth daily.  0  . ondansetron (ZOFRAN ODT) 4 MG disintegrating tablet Take 1 tablet (4 mg total) by mouth every 8 (eight) hours as needed. 20 tablet 6  . pantoprazole (PROTONIX) 40 MG tablet Take 1 tablet by mouth every morning.  2  . rizatriptan (MAXALT-MLT) 10 MG disintegrating tablet Take  1 tablet (10 mg total) by mouth as needed. May repeat in 2 hours if needed 15 tablet 6  . tiZANidine (ZANAFLEX) 2 MG tablet Take 1 tablet (2 mg total) by mouth every 8 (eight) hours as needed. She is unsure of dose.  Rarely takes this medication. 60 tablet 5  . venlafaxine (EFFEXOR) 75 MG tablet Take 75 mg by mouth 2 (two)  times daily.      No facility-administered medications prior to visit.     PAST MEDICAL HISTORY: Past Medical History:  Diagnosis Date  . Anxiety   . Asthma   . Chronic neck pain   . Depression   . Diabetes (HCC)   . Family history of adverse reaction to anesthesia    Mother had PONV  . GERD (gastroesophageal reflux disease)   . Headache    Migraines  . Migraine   . Numbness and tingling of right arm   . PONV (postoperative nausea and vomiting)   . Thyroid disease   . Wears glasses     PAST SURGICAL HISTORY: Past Surgical History:  Procedure Laterality Date  . POSTERIOR CERVICAL FUSION/FORAMINOTOMY N/A 07/19/2014   Procedure: POSTERIOR CERVICAL FUSION/FORAMINOTOMY CERVICAL ONE-TWO WITH INSTRUMENTATION;  Surgeon: Tressie StalkerJeffrey Jenkins, MD;  Location: [facility];  Service: Neurosurgery;  Laterality: N/A;  . WISDOM TOOTH EXTRACTION      FAMILY HISTORY: Family History  Problem Relation Age of Onset  . Asthma Mother   . Thyroid disease Mother   . High blood pressure Mother   . Cancer - Lung Father   . COPD Sister   . Hypercholesterolemia Maternal Grandfather   . Lung cancer Maternal Grandfather   . Diabetes Paternal Grandmother     SOCIAL HISTORY: Social History   Socioeconomic History  . Marital status: Single    Spouse name: Not on file  . Number of children: 1  . Years of education: some college  . Highest education level: Not on file  Occupational History  . Occupation: own business     Comment: skin care products  . Occupation: own business    Comment: vending (snacks, drinks)  Social Needs  . Financial resource strain: Not on file  . Food insecurity:    Worry: Not on file    Inability: Not on file  . Transportation needs:    Medical: Not on file    Non-medical: Not on file  Tobacco Use  . Smoking status: Never Smoker  . Smokeless tobacco: Never Used  Substance and Sexual Activity  . Alcohol use: No  . Drug use: No  . Sexual activity: Not on file    Lifestyle  . Physical activity:    Days per week: Not on file    Minutes per session: Not on file  . Stress: Not on file  Relationships  . Social connections:    Talks on phone: Not on file    Gets together: Not on file    Attends religious service: Not on file    Active member of club or organization: Not on file    Attends meetings of clubs or organizations: Not on file    Relationship status: Not on file  . Intimate partner violence:    Fear of current or ex partner: Not on file    Emotionally abused: Not on file    Physically abused: Not on file    Forced sexual activity: Not on file  Other Topics Concern  . Not on file  Social History Narrative  Lives at home with her fiance.   Right-handed.   No caffeine use.     PHYSICAL EXAM  There were no vitals filed for this visit. There is no height or weight on file to calculate BMI.  Generalized: Well developed, in no acute distress  Head: normocephalic and atraumatic,. Oropharynx benign  Neck: Supple, no carotid bruits  Cardiac: Regular rate rhythm, no murmur  Musculoskeletal: No deformity   Neurological examination   Mentation: Alert oriented to time, place, history taking. Attention span and concentration appropriate. Recent and remote memory intact.  Follows all commands speech and language fluent.   Cranial nerve II-XII: Fundoscopic exam reveals sharp disc margins.Pupils were equal round reactive to light extraocular movements were full, visual field were full on confrontational test. Facial sensation and strength were normal. hearing was intact to finger rubbing bilaterally. Uvula tongue midline. head turning and shoulder shrug were normal and symmetric.Tongue protrusion into cheek strength was normal. Motor: normal bulk and tone, full strength in the BUE, BLE, fine finger movements normal, no pronator drift. No focal weakness Sensory: normal and symmetric to light touch, pinprick, and  Vibration, proprioception   Coordination: finger-nose-finger, heel-to-shin bilaterally, no dysmetria Reflexes: Brachioradialis 2/2, biceps 2/2, triceps 2/2, patellar 2/2, Achilles 2/2, plantar responses were flexor bilaterally. Gait and Station: Rising up from seated position without assistance, normal stance,  moderate stride, good arm swing, smooth turning, able to perform tiptoe, and heel walking without difficulty. Tandem gait is steady  DIAGNOSTIC DATA (LABS, IMAGING, TESTING) - I reviewed patient records, labs, notes, testing and imaging myself where available.  Lab Results  Component Value Date   WBC 10.7 (H) 07/12/2014   HGB 12.8 07/12/2014   HCT 38.6 07/12/2014   MCV 87.7 07/12/2014   PLT 401 (H) 07/12/2014      Component Value Date/Time   NA 139 07/12/2014 1504   K 4.3 07/12/2014 1504   CL 101 07/12/2014 1504   CO2 25 07/12/2014 1504   GLUCOSE 140 (H) 07/12/2014 1504   BUN 10 07/12/2014 1504   CREATININE 0.67 07/12/2014 1504   CALCIUM 9.0 07/12/2014 1504   GFRNONAA >90 07/12/2014 1504   GFRAA >90 07/12/2014 1504   No results found for: CHOL, HDL, LDLCALC, LDLDIRECT, TRIG, CHOLHDL No results found for: JQBH4L No results found for: VITAMINB12 No results found for: TSH    ASSESSMENT AND PLAN  48 y.o. year old female  has a past medical history of Anxiety, Asthma, Chronic neck pain, Depression, Diabetes (HCC), Family history of adverse reaction to anesthesia, GERD (gastroesophageal reflux disease), Headache, Migraine, Numbness and tingling of right arm, PONV (postoperative nausea and vomiting), Thyroid disease, and Wears glasses. here with *** Wendy Monroe is a 48 y.o. female   Chronic migraine headaches             Already on polypharmacy treatment including Effexor, recently added on Elavil, also on BuSpar, clonazepam,             Previously tried and failed Topamax, Inderal,             Add on Aimovig 70 mg once every month as preventive medication             Maxalt as needed, for severe  protracted migraine may combine it together with tizanidine, Zofran, Aleve,             Return to clinic in 3 months with nurse practitioner   Nilda Riggs, [facility], [facility], APRN  [facility]  [place], [address] [place], [place] [address] [phone]

## 2018-09-21 ENCOUNTER — Ambulatory Visit: Payer: BLUE CROSS/BLUE SHIELD | Admitting: Nurse Practitioner

## 2018-09-22 ENCOUNTER — Encounter: Payer: Self-pay | Admitting: Nurse Practitioner

## 2018-10-08 NOTE — Progress Notes (Signed)
[facility]  PATIENT: Wendy Monroe DOB: 08-15-1970   REASON FOR VISIT: Follow-up for migraine HISTORY FROM: Patient    HISTORY OF PRESENT ILLNESS:  06/10/18 Wendy Monroe is a 48 year old female, seen in request by her primary care physician Dr. Buckner Malta for evaluation of migraine headaches, initial evaluation was on June 10, 2018.  I have reviewed and summarized the referring note from the referring physician.  She had a past medical history of diabetes, depression, posterior cervical fusion in 2015, still complains of frequent neck pain, radiating pain to bilateral shoulder.  She reported history of migraine headaches since high school, her typical migraine are lateralized severe pounding headache with associated light sensitivity, nauseous, alleviated by sleeping in dark quiet room,  Over the years, she had gradual worsening migraine headache, since 2018, she has migraines 3-4 times each week, lasting for few hours, previously she tried Imitrex, Maxalt works better, but complains some throat tightness, recently she was given prescription of Fioricet, also works for her migraine,  For preventive medication, she has tried Topamax, help her headache, but could not tolerate the side effect of numbness tingling mental slowing, Inderal does not help her, was recently started on Elavil, she is already on polypharmacy treatment including Effexor, BuSpar, clonazepam, UPDATE 3/2/2020CM [name], 48 year old female returns for follow-up with a history of migraine headaches.  She has been on multiple preventatives in the past.  When last seen she was asked to go on Aimovig.  The PA was done however her pharmacy sent to the local [facility] since they do not keep it in stock and she was never called that the prescription was ready.  She takes Maxalt acutely with relief she needs refills.  She is not aware of any particular migraine triggers.  She returns for  reevaluation REVIEW OF SYSTEMS: Full 14 system review of systems performed and notable only for those listed, all others are neg:  Constitutional: neg  Cardiovascular: neg Ear/Nose/Throat: neg  Skin: neg Eyes: Blurred vision, light sensitivity Respiratory: neg Gastroitestinal: neg  Hematology/Lymphatic: neg  Endocrine: Excessive thirst Musculoskeletal:neg Allergy/Immunology: neg Neurological: Migraine  Psychiatric: Depression anxiety Sleep : neg   ALLERGIES: Allergies  Allergen Reactions  . Erythromycin Other (See Comments)    "Pain worse than labor"  . Hctz [Hydrochlorothiazide] Itching  . Penicillins Hives  . Prednisone Other (See Comments)    High doses of prednisone:"make me crazy"  . Sulfa Antibiotics Hives  . Vivactil [Protriptyline Hcl]     HOME MEDICATIONS: Outpatient Medications Prior to Visit  Medication Sig Dispense Refill  . amitriptyline (ELAVIL) 75 MG tablet Take 75 mg by mouth at bedtime.    . busPIRone (BUSPAR) 15 MG tablet Takes 2 tablets in the morning and 1 tablet in the evening.  1  . butalbital-acetaminophen-caffeine (FIORICET, ESGIC) 50-325-40 MG tablet as needed.  1  . Cetirizine HCl (ZYRTEC PO) Take 10 mg by mouth as needed.    . clonazePAM (KLONOPIN PO) Take 10 mg by mouth as needed.    . cyclobenzaprine (FLEXERIL) 10 MG tablet Take 10 mg by mouth as needed for muscle spasms (prn).    . Dapagliflozin Propanediol (FARXIGA PO) Take 10 mg by mouth daily.     . diazepam (VALIUM) 5 MG tablet Take 1 tablet by mouth as needed.  0  . levothyroxine (SYNTHROID, LEVOTHROID) 100 MCG tablet Take 100 mcg by mouth daily.    . metFORMIN (GLUCOPHAGE-XR) 500 MG 24 hr tablet Take 4 tablets by mouth daily.  0  . norethindrone (MICRONOR,CAMILA,ERRIN) 0.35 MG tablet Take 1 tablet by mouth daily.  0  . ondansetron (ZOFRAN ODT) 4 MG disintegrating tablet Take 1 tablet (4 mg total) by mouth every 8 (eight) hours as needed. 20 tablet 6  . pantoprazole (PROTONIX) 40 MG  tablet Take 1 tablet by mouth every morning.  2  . rizatriptan (MAXALT-MLT) 10 MG disintegrating tablet Take 1 tablet (10 mg total) by mouth as needed. May repeat in 2 hours if needed 15 tablet 6  . tiZANidine (ZANAFLEX) 2 MG tablet Take 1 tablet (2 mg total) by mouth every 8 (eight) hours as needed. She is unsure of dose.  Rarely takes this medication. 60 tablet 5  . venlafaxine (EFFEXOR) 75 MG tablet Take 75 mg by mouth 2 (two) times daily.     Dorise Hiss (AIMOVIG) 70 MG/ML SOAJ Inject 70 mg into the skin every 30 (thirty) days. (Patient not taking: Reported on 10/11/2018) 1 mL 11  . CYCLOBENZAPRINE HCL PO Take 1 tablet by mouth as needed. She is unsure of dosage.  Rarely takes this medication.     No facility-administered medications prior to visit.     PAST MEDICAL HISTORY: Past Medical History:  Diagnosis Date  . Anxiety   . Asthma   . Chronic neck pain   . Depression   . Diabetes (HCC)   . Family history of adverse reaction to anesthesia    Mother had PONV  . GERD (gastroesophageal reflux disease)   . Headache    Migraines  . Migraine   . Numbness and tingling of right arm   . PONV (postoperative nausea and vomiting)   . Thyroid disease   . Wears glasses     PAST SURGICAL HISTORY: Past Surgical History:  Procedure Laterality Date  . POSTERIOR CERVICAL FUSION/FORAMINOTOMY N/A 07/19/2014   Procedure: POSTERIOR CERVICAL FUSION/FORAMINOTOMY CERVICAL ONE-TWO WITH INSTRUMENTATION;  Surgeon: Tressie Stalker, MD;  Location: [facility];  Service: Neurosurgery;  Laterality: N/A;  . WISDOM TOOTH EXTRACTION      FAMILY HISTORY: Family History  Problem Relation Age of Onset  . Asthma Mother   . Thyroid disease Mother   . High blood pressure Mother   . Cancer - Lung Father   . COPD Sister   . Hypercholesterolemia Maternal Grandfather   . Lung cancer Maternal Grandfather   . Diabetes Paternal Grandmother     SOCIAL HISTORY: Social History   Socioeconomic History  .  Marital status: Single    Spouse name: Not on file  . Number of children: 1  . Years of education: some college  . Highest education level: Not on file  Occupational History  . Occupation: own business     Comment: skin care products  . Occupation: own business    Comment: vending (snacks, drinks)  Social Needs  . Financial resource strain: Not on file  . Food insecurity:    Worry: Not on file    Inability: Not on file  . Transportation needs:    Medical: Not on file    Non-medical: Not on file  Tobacco Use  . Smoking status: Never Smoker  . Smokeless tobacco: Never Used  Substance and Sexual Activity  . Alcohol use: No  . Drug use: No  . Sexual activity: Not on file  Lifestyle  . Physical activity:    Days per week: Not on file    Minutes per session: Not on file  . Stress: Not on file  Relationships  .  Social connections:    Talks on phone: Not on file    Gets together: Not on file    Attends religious service: Not on file    Active member of club or organization: Not on file    Attends meetings of clubs or organizations: Not on file    Relationship status: Not on file  . Intimate partner violence:    Fear of current or ex partner: Not on file    Emotionally abused: Not on file    Physically abused: Not on file    Forced sexual activity: Not on file  Other Topics Concern  . Not on file  Social History Narrative   Lives at home with her fiance.   Right-handed.   No caffeine use.     PHYSICAL EXAM  Vitals:   10/11/18 0935  BP: 131/88  Pulse: 100  Weight: 193 lb 12.8 oz (87.9 kg)  Height: 5\' 2"  (1.575 m)   Body mass index is 35.45 kg/m.  Generalized: Well developed, obese female in no acute distress  Head: normocephalic and atraumatic,. Oropharynx benign  Neck: Supple,   Musculoskeletal: No deformity  Skin no rash or edema Neurological examination   Mentation: Alert oriented to time, place, history taking. Attention span and concentration  appropriate. Recent and remote memory intact.  Follows all commands speech and language fluent.   Cranial nerve II-XII: Pupils were equal round reactive to light extraocular movements were full, visual field were full on confrontational test. Facial sensation and strength were normal. hearing was intact to finger rubbing bilaterally. Uvula tongue midline. head turning and shoulder shrug were normal and symmetric.Tongue protrusion into cheek strength was normal. Motor: normal bulk and tone, full strength in the BUE, BLE, Sensory: normal and symmetric to light touch, Coordination: finger-nose-finger, heel-to-shin bilaterally, no dysmetria Reflexes: Symmetric upper and lower plantar responses were flexor bilaterally. Gait and Station: Rising up from seated position without assistance, normal stance,  moderate stride, good arm swing, smooth turning, able to perform tiptoe, and heel walking without difficulty. Tandem gait is steady  DIAGNOSTIC DATA (LABS, IMAGING, TESTING) - I reviewed patient records, labs, notes, testing and imaging myself where available.  Lab Results  Component Value Date   WBC 10.7 (H) 07/12/2014   HGB 12.8 07/12/2014   HCT 38.6 07/12/2014   MCV 87.7 07/12/2014   PLT 401 (H) 07/12/2014      Component Value Date/Time   NA 139 07/12/2014 1504   K 4.3 07/12/2014 1504   CL 101 07/12/2014 1504   CO2 25 07/12/2014 1504   GLUCOSE 140 (H) 07/12/2014 1504   BUN 10 07/12/2014 1504   CREATININE 0.67 07/12/2014 1504   CALCIUM 9.0 07/12/2014 1504   GFRNONAA >90 07/12/2014 1504   GFRAA >90 07/12/2014 1504     ASSESSMENT AND PLAN  48 y.o. year old female with history of chronic migraine headaches already on polypharmacy to include Effexor Elavil BuSpar clonazepam Flexeril.    Has failed Topamax and Inderal in the past.                PLAN:             Start  Aimovig 70 mg once every month as preventive medication PA done             Maxalt as needed, for severe protracted  migraine may combine it together with  tizanidine, Zofran, Aleve,  Given a list of foods that are migraine triggers reviewed these with the  patient  eliminate one at a time             Return to clinic in 6 months with nurse practitioner I spent 20 minutes in total face to face time with the patient more than 50% of which was spent counseling and coordination of care, reviewing test results reviewing medications and discussing and reviewing the diagnosis of migraine headaches and further treatment options. , Cline Crock, [facility], APRN  [facility] [address], [address] [place], [place] [address] [phone]

## 2018-10-11 ENCOUNTER — Encounter: Payer: Self-pay | Admitting: Nurse Practitioner

## 2018-10-11 ENCOUNTER — Ambulatory Visit: Payer: BLUE CROSS/BLUE SHIELD | Admitting: Nurse Practitioner

## 2018-10-11 VITALS — BP 131/88 | HR 100 | Ht 62.0 in | Wt 193.8 lb

## 2018-10-11 DIAGNOSIS — G43709 Chronic migraine without aura, not intractable, without status migrainosus: Secondary | ICD-10-CM

## 2018-10-11 DIAGNOSIS — IMO0002 Reserved for concepts with insufficient information to code with codable children: Secondary | ICD-10-CM

## 2018-10-11 MED ORDER — RIZATRIPTAN BENZOATE 10 MG PO TBDP: 10.0000 mg | ORAL_TABLET | ORAL | 6 refills | Status: AC | PRN

## 2018-10-11 NOTE — Progress Notes (Signed)
I have reviewed and agreed above plan. 

## 2018-10-11 NOTE — Patient Instructions (Signed)
Start  Aimovig 70 mg once every month as preventive medication             Maxalt as needed, for severe protracted migraine may combine it together with  tizanidine, Zofran, Aleve,  Given a list of foods that are migraine triggers             Return to clinic in 6 months with nurse practitioner

## 2019-04-14 ENCOUNTER — Ambulatory Visit: Payer: BLUE CROSS/BLUE SHIELD | Admitting: Neurology

## 2019-08-17 ENCOUNTER — Telehealth: Payer: Self-pay | Admitting: *Deleted

## 2019-08-17 NOTE — Telephone Encounter (Signed)
PA for continuation of Aimovig completed through covermymeds (key: [id number]). Pt has coverage with [employer] [phone]) - HO#[id number]. Decision pending.

## 2019-08-18 NOTE — Telephone Encounter (Signed)
PA approved. Effective from 08/17/2019 through 08/15/2020

## 2020-06-27 ENCOUNTER — Other Ambulatory Visit (HOSPITAL_COMMUNITY): Payer: Self-pay

## 2020-06-27 ENCOUNTER — Other Ambulatory Visit: Payer: Self-pay

## 2020-06-27 ENCOUNTER — Encounter: Payer: Self-pay | Admitting: Internal Medicine

## 2020-06-27 ENCOUNTER — Ambulatory Visit: Payer: BC Managed Care – PPO | Admitting: Internal Medicine

## 2020-06-27 VITALS — BP 151/98 | HR 103 | Temp 97.9°F

## 2020-06-27 DIAGNOSIS — K047 Periapical abscess without sinus: Secondary | ICD-10-CM

## 2020-06-27 MED ORDER — MAGIC MOUTHWASH W/LIDOCAINE: 15.0000 mL | Freq: Three times a day (TID) | ORAL | Status: DC | PRN

## 2020-06-27 MED ORDER — MAGIC MOUTHWASH W/LIDOCAINE: 5.0000 mL | Freq: Three times a day (TID) | ORAL | 0 refills | Status: AC | PRN

## 2020-06-27 MED ORDER — AMOXICILLIN-POT CLAVULANATE 875-125 MG PO TABS: 1.0000 | ORAL_TABLET | Freq: Two times a day (BID) | ORAL | 1 refills | Status: AC

## 2020-06-27 NOTE — Addendum Note (Signed)
Addended by: Andree Coss on: 06/27/2020 05:28 PM   Modules accepted: Orders

## 2020-06-27 NOTE — Patient Instructions (Addendum)
If you have ongoing fever, chill, increased swelling, headache, confusion please go to ed   Will start 3 weeks augmentin    Will also get cbc, cmp, crp

## 2020-06-27 NOTE — Progress Notes (Signed)
[facility]  Reason for Consult:odontogenic infection Referring Provider: dental clinic     Patient Active Problem List   Diagnosis Date Noted  . Chronic migraine 06/10/2018  . 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   BUSPIRONE (BUSPAR) 15 MG TABLET    Takes 2 tablets in the morning and 1 tablet in the evening.   BUTALBITAL-ACETAMINOPHEN-CAFFEINE (FIORICET, ESGIC) 50-325-40 MG TABLET    as needed.   CETIRIZINE HCL (ZYRTEC PO)    Take 10 mg by mouth as needed.   CLONAZEPAM (KLONOPIN PO)    Take 10 mg by mouth as needed.   CYCLOBENZAPRINE (FLEXERIL) 10 MG TABLET    Take 10 mg by mouth as needed for muscle spasms (prn).   DAPAGLIFLOZIN PROPANEDIOL (FARXIGA PO)    Take 10 mg by mouth daily.    DIAZEPAM (VALIUM) 5 MG TABLET    Take 1 tablet by mouth as needed.   ERENUMAB-AOOE (AIMOVIG) 70 MG/ML SOAJ    Inject 70 mg into the skin every 30 (thirty) days.   LEVOTHYROXINE (SYNTHROID, LEVOTHROID) 100 MCG TABLET    Take 100 mcg by mouth daily.   METFORMIN (GLUCOPHAGE-XR) 500 MG 24 HR TABLET    Take 4 tablets by mouth daily.    NORETHINDRONE (MICRONOR,CAMILA,ERRIN) 0.35 MG TABLET    Take 1 tablet by mouth daily.   ONDANSETRON (ZOFRAN ODT) 4 MG DISINTEGRATING TABLET    Take 1 tablet (4 mg total) by mouth every 8 (eight) hours as needed.   PANTOPRAZOLE (PROTONIX) 40 MG TABLET    Take 1 tablet by mouth every morning.   RIZATRIPTAN (MAXALT-MLT) 10 MG DISINTEGRATING TABLET    Take 1 tablet (10 mg total) by mouth as needed. May repeat in 2 hours if needed   TIZANIDINE (ZANAFLEX) 2 MG TABLET    Take 1 tablet (2 mg total) by mouth every 8 (eight) hours as needed. She is unsure of dose.  Rarely takes this medication.   VENLAFAXINE (EFFEXOR) 75 MG TABLET    Take 75 mg by mouth 2 (two) times daily.   Modified  Medications   No medications on file  Discontinued Medications   No medications on file    HPI: Wendy Monroe is a 49 y.o. female referred from dental clnic for right facial/jaw pain and right lower molar tooth infection  She has had issue (pain) with her right lower molar for a couple weeks now, noticing pus around the tooth. She tried to pick on it and express the pus as well. A week ago had tooth extraction but no abx given. After tooth extraction had severe pain and was given clindamycin by her dentist. Pain continues 10/10. Today seen by [facility] there was no swelling noted and referred to me for evaluation/tx  Of note, there was reported swelling before beign seen by dental clinic today  No f/c  No hx dental infection in that area  Allergic to pcn (hives as a 49yrold) but taken augmentin repeatedly for prior sinusitis  Review of Systems: ROS No diarrhea Negative n/v or rash Negative 11 point ros unless mentioned above   Past Medical History:  Diagnosis Date  . Anxiety   . Asthma   . Chronic neck pain   . Depression   .  Diabetes ([place])   . Family history of adverse reaction to anesthesia    Mother had PONV  . GERD (gastroesophageal reflux disease)   . Headache    Migraines  . Migraine   . Numbness and tingling of right arm   . PONV (postoperative nausea and vomiting)   . Thyroid disease   . Wears glasses     Social History   Tobacco Use  . Smoking status: Never Smoker  . Smokeless tobacco: Never Used  Substance Use Topics  . Alcohol use: No  . Drug use: No    Family History  Problem Relation Age of Onset  . Asthma Mother   . Thyroid disease Mother   . High blood pressure Mother   . Cancer - Lung Father   . COPD Sister   . Hypercholesterolemia Maternal Grandfather   . Lung cancer Maternal Grandfather   . Diabetes Paternal Grandmother    Allergies  Allergen Reactions  . Erythromycin Other (See Comments)    "Pain worse than labor"  . Hctz  [Hydrochlorothiazide] Itching  . Penicillins Hives  . Prednisone Other (See Comments)    High doses of prednisone:"make me crazy"  . Sulfa Antibiotics Hives  . Vivactil [Protriptyline Hcl]     OBJECTIVE: Vitals:   06/27/20 1617  BP: (!) 151/98  Pulse: (!) 103  Temp: 97.9 F (36.6 C)  TempSrc: Oral   There is no height or weight on file to calculate BMI.   Physical Exam Mild distress, conversant, pleasant Normocephalic; per; conj clear, eomi; right lower molar is packed; there is minor ulceration on the tooth lateral gum line; no swelling or purulence expressed Neck supple Skin warmth lateral right face along jaw neck; no rash  cv rrr no mrg Lungs clear abd s/nt Ext no edema Neuro cn2-12 intact; nonfocal Psych alert/oriented  Lab:  Microbiology: No results found for this or any previous visit (from the past 240 hour(s)).  Serology:  Imaging:   Assessment/plan: #odontogenic infection with what seems to clinically be an abscess previously Hx pcn childhood mild allergy but tolerated augmentin Azithromycin would probably work with likely not great statphylococcus/actino or anaerobic gpc coverage  -augmentin 3 weeks -reevaluate 2 weeks -ed if swelling, increased pain, confusion/headache, ongoing f/c after 2-3 days  -no culture needed at this time; but if worsening progression involving bone or extensive abscess needing debridement would get deep tissue/bone culture at that time  -lidocaine magic mouth wash for focal ulcer around the gum line   Patient Active Problem List   Diagnosis Date Noted  . Chronic migraine 06/10/2018  . 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   No orders of the defined types were placed in this encounter.    Follow-up: Return in about 2 weeks (around 07/11/2020).  Jabier Mutton, [facility] [place] -- -- pager   [phone] cell 06/27/2020, 5:00 PM

## 2020-06-27 NOTE — Progress Notes (Signed)
Magic Mouthwash prescription called in to pharmacy.  Andree Coss, RN

## 2020-06-28 ENCOUNTER — Other Ambulatory Visit: Payer: Self-pay

## 2020-06-28 DIAGNOSIS — K047 Periapical abscess without sinus: Secondary | ICD-10-CM

## 2020-06-28 LAB — CBC
HCT: 44.4 % (ref 35.0–45.0)
Hemoglobin: 14.9 g/dL (ref 11.7–15.5)
MCH: 28.4 pg (ref 27.0–33.0)
MCHC: 33.6 g/dL (ref 32.0–36.0)
MCV: 84.7 fL (ref 80.0–100.0)
MPV: 8.5 fL (ref 7.5–12.5)
Platelets: 621 10*3/uL — ABNORMAL HIGH (ref 140–400)
RBC: 5.24 10*6/uL — ABNORMAL HIGH (ref 3.80–5.10)
RDW: 13 % (ref 11.0–15.0)
WBC: 14.6 10*3/uL — ABNORMAL HIGH (ref 3.8–10.8)

## 2020-06-28 LAB — COMPREHENSIVE METABOLIC PANEL
AG Ratio: 1.8 (calc) (ref 1.0–2.5)
ALT: 73 U/L — ABNORMAL HIGH (ref 6–29)
AST: 41 U/L — ABNORMAL HIGH (ref 10–35)
Albumin: 4.9 g/dL (ref 3.6–5.1)
Alkaline phosphatase (APISO): 128 U/L — ABNORMAL HIGH (ref 31–125)
BUN: 13 mg/dL (ref 7–25)
CO2: 27 mmol/L (ref 20–32)
Calcium: 10.5 mg/dL — ABNORMAL HIGH (ref 8.6–10.2)
Chloride: 91 mmol/L — ABNORMAL LOW (ref 98–110)
Creat: 0.8 mg/dL (ref 0.50–1.10)
Globulin: 2.7 g/dL (calc) (ref 1.9–3.7)
Glucose, Bld: 338 mg/dL — ABNORMAL HIGH (ref 65–99)
Potassium: 4.8 mmol/L (ref 3.5–5.3)
Sodium: 131 mmol/L — ABNORMAL LOW (ref 135–146)
Total Bilirubin: 0.3 mg/dL (ref 0.2–1.2)
Total Protein: 7.6 g/dL (ref 6.1–8.1)

## 2020-06-28 LAB — C-REACTIVE PROTEIN: CRP: 6.6 mg/L (ref <8.0)

## 2021-02-28 ENCOUNTER — Ambulatory Visit (INDEPENDENT_AMBULATORY_CARE_PROVIDER_SITE_OTHER): Payer: BC Managed Care – PPO | Admitting: Allergy and Immunology

## 2021-02-28 ENCOUNTER — Other Ambulatory Visit: Payer: Self-pay

## 2021-02-28 ENCOUNTER — Encounter: Payer: Self-pay | Admitting: Allergy and Immunology

## 2021-02-28 ENCOUNTER — Telehealth: Payer: Self-pay

## 2021-02-28 VITALS — BP 130/80 | HR 104 | Resp 12 | Ht 61.5 in | Wt 182.0 lb

## 2021-02-28 DIAGNOSIS — E038 Other specified hypothyroidism: Secondary | ICD-10-CM

## 2021-02-28 DIAGNOSIS — L989 Disorder of the skin and subcutaneous tissue, unspecified: Secondary | ICD-10-CM | POA: Diagnosis not present

## 2021-02-28 DIAGNOSIS — L299 Pruritus, unspecified: Secondary | ICD-10-CM

## 2021-02-28 DIAGNOSIS — R7989 Other specified abnormal findings of blood chemistry: Secondary | ICD-10-CM

## 2021-02-28 MED ORDER — PIMECROLIMUS 1 % EX CREA: TOPICAL_CREAM | CUTANEOUS | 5 refills | Status: DC

## 2021-02-28 MED ORDER — MOMETASONE FUROATE 0.1 % EX OINT: TOPICAL_OINTMENT | CUTANEOUS | 5 refills | Status: AC

## 2021-02-28 NOTE — Progress Notes (Signed)
[place]   Dear Dr. Sherral Hammersobbins,  Thank you for referring Wendy ReeveShelia Monroe [facility] [place] on 02/28/2021.   Below is a summation of this patient's evaluation and recommendations.  Thank you for your referral. I will keep you informed about this patient's response to treatment.   If you have any questions please do not hesitate to contact me.   Sincerely,  Jessica PriestEric J. Ronnita Paz, MD Allergy / Immunology [facility]   ______________________________________________________________________    NEW PATIENT NOTE  Referring Provider: Hadley Penobbins, Robert A, MD Primary Provider: Hadley Penobbins, Robert A, MD Date of office visit: 02/28/2021    Subjective:   Chief Complaint:  Wendy ReeveShelia Koller (DOB: 08-Jun-1971) is a 50 y.o. female who presents to the clinic on 02/28/2021 with a chief complaint of Urticaria .     HPI: Velna HatchetSheila presents to this clinic in evaluation of asthma, allergic rhinitis, LPR, and dermatitis. I had seen her in this clinic in 2018 for an issue tied up with asthma and allergic rhinitis and LPR.  She has really had very little issues with her asthma and her requirement for short acting bronchodilators just a few times per month usually when she tends to exert herself in the heat.  It does not sound as though she has required a systemic steroid to treat an exacerbation of asthma in many many years.  Likewise, she has had very little issues with her nose after she attended to a remnant tooth root In her left maxillary sinus that required surgical removal in April 2022.  She still has this unusual pressure sensation in her left cheek and that appears to be a persistent issue even after the surgery.   And her reflux is under good control as long as she continues on Protonix.  Her most recent issue is the fact that she has been developing some type of dermatitis mostly  on her arms and legs but occasionally her neck and ear that is intensely itchy.  She thought that she developed a "bite" on her arms during the initial presentation of this issue and she was seen by her primary care doctor who treated her with Elimite with 2 applications and then she went to the urgent care center on what sounds like 2 occasions and was treated with 2 Kenalog injections then given some topical steroid and hydroxyzine.  Hydroxyzine does help her itching but she gets very sedated from this agent and is only using this agent at night.  But her dermatitis has not really responded to any of this therapy.  She does excoriate her skin significantly and rips off the tops of some of these lesions.  In addition, she has had a "sore tongue and lots of redness of her tongue" and now when she eats spicy food it feels as though her tongue is on fire.  Otherwise, she has not had any associated systemic or constitutional symptoms.  She does not really note an obvious provoking factor giving rise to this dermatitis.  She has not had a significant environmental change, started any new medications, started any new supplements, or have symptoms of an persistent ongoing infectious disease.  Her live-in partner does not have any type of pruritic disorder.  Past Medical History:  Diagnosis Date   Anxiety    Asthma    Chronic neck pain    Depression    Diabetes (  HCC)    Family history of adverse reaction to anesthesia    Mother had PONV   GERD (gastroesophageal reflux disease)    Headache    Migraines   Migraine    Numbness and tingling of right arm    PONV (postoperative nausea and vomiting)    Thyroid disease    Wears glasses     Past Surgical History:  Procedure Laterality Date   POSTERIOR CERVICAL FUSION/FORAMINOTOMY N/A 07/19/2014   Procedure: POSTERIOR CERVICAL FUSION/FORAMINOTOMY CERVICAL ONE-TWO WITH INSTRUMENTATION;  Surgeon: Tressie Stalker, MD;  Location: [facility];  Service:  Neurosurgery;  Laterality: N/A;   WISDOM TOOTH EXTRACTION      Allergies as of 02/28/2021       Reactions   Erythromycin Other (See Comments)   "Pain worse than labor"   Hctz [hydrochlorothiazide] Itching   Penicillins Hives   Prednisone Other (See Comments), Itching   High doses of prednisone:"make me crazy" Other reaction(s): Other (see comments) High doses of prednisone:"make me crazy" High doses of prednisone:"make me crazy"   Sulfa Antibiotics Hives   Vivactil [protriptyline Hcl]         Medication List    Aimovig 140 MG/ML Soaj Generic drug: Erenumab-aooe SMARTSIG:140 Milligram(s) SUB-Q   albuterol 108 (90 Base) MCG/ACT inhaler Commonly known as: VENTOLIN HFA Inhale 2 puffs into the lungs every 4 (four) hours as needed.   amitriptyline 75 MG tablet Commonly known as: ELAVIL Take 75 mg by mouth at bedtime.   cyclobenzaprine 10 MG tablet Commonly known as: FLEXERIL Take 10 mg by mouth as needed for muscle spasms (prn).   diazepam 5 MG tablet Commonly known as: VALIUM Take 1 tablet by mouth as needed.   escitalopram 20 MG tablet Commonly known as: LEXAPRO Take 20 mg by mouth at bedtime.   hydrOXYzine 10 MG tablet Commonly known as: ATARAX/VISTARIL Take 10 mg by mouth 3 (three) times daily as needed.   hydrOXYzine 10 MG tablet Commonly known as: ATARAX/VISTARIL Take by mouth.   KLONOPIN PO Take 10 mg by mouth as needed.   levothyroxine 100 MCG tablet Commonly known as: SYNTHROID Take 100 mcg by mouth daily.   metFORMIN 1000 MG tablet Commonly known as: GLUCOPHAGE Take 1,000 mg by mouth 2 (two) times daily.   montelukast 10 MG tablet Commonly known as: SINGULAIR Take 10 mg by mouth daily.   norethindrone 0.35 MG tablet Commonly known as: MICRONOR Take 1 tablet by mouth daily.   Ozempic (0.25 or 0.5 MG/DOSE) 2 MG/1.5ML Sopn Generic drug: Semaglutide(0.25 or 0.5MG /DOS) Inject 0.5 mg into the skin once a week.   pantoprazole 40 MG  tablet Commonly known as: PROTONIX Take 1 tablet by mouth every morning.   rizatriptan 10 MG disintegrating tablet Commonly known as: Maxalt-MLT Take 1 tablet (10 mg total) by mouth as needed. May repeat in 2 hours if needed   triamcinolone cream 0.1 % Commonly known as: KENALOG SMARTSIG:Topical 1-2 Times Daily PRN   venlafaxine XR 150 MG 24 hr capsule Commonly known as: EFFEXOR-XR Take 150 mg by mouth 2 (two) times daily.   Vitamin D (Ergocalciferol) 1.25 MG (50000 UNIT) Caps capsule Commonly known as: DRISDOL Take 50,000 Units by mouth once a week.   ZYRTEC PO Take 10 mg by mouth as needed.        Review of systems negative except as noted in HPI / PMHx or noted below:  Review of Systems  Constitutional: Negative.   HENT: Negative.    Eyes: Negative.  Respiratory: Negative.    Cardiovascular: Negative.   Gastrointestinal: Negative.   Genitourinary: Negative.   Musculoskeletal: Negative.   Skin: Negative.   Neurological: Negative.   Endo/Heme/Allergies: Negative.   Psychiatric/Behavioral: Negative.     Family History  Problem Relation Age of Onset   Asthma Mother    Thyroid disease Mother    High blood pressure Mother    High Cholesterol Mother    Cancer - Lung Father    COPD Sister    Hypercholesterolemia Maternal Grandfather    Lung cancer Maternal Grandfather    Diabetes Paternal Grandmother     Social History   Socioeconomic History   Marital status: Single    Spouse name: Not on file   Number of children: 1   Years of education: some college   Highest education level: Not on file  Occupational History   Occupation: own business     Comment: skin care products   Occupation: own business    Comment: vending (snacks, drinks)  Tobacco Use   Smoking status: Never   Smokeless tobacco: Never  Substance and Sexual Activity   Alcohol use: Yes    Comment: RARE   Drug use: No   Sexual activity: Not on file  Other Topics Concern   Not on file   Social History Narrative   Lives at home with her fiance.   Right-handed.   No caffeine use.   Environmental and Social history  Lives in a house with a dry environment, a dog located inside the household, carpet in the bedroom, no plastic on the bed, no plastic on the pillow, no smoking ongoing with inside the household.  She works in an office setting.  Objective:   Vitals:   02/28/21 0911  BP: 130/80  Pulse: (!) 104  Resp: 12  SpO2: 98%   Height: 5' 1.5" (156.2 cm) Weight: 182 lb (82.6 kg)  Physical Exam Constitutional:      Appearance: She is not diaphoretic.  HENT:     Head: Normocephalic.     Right Ear: Tympanic membrane, ear canal and external ear normal.     Left Ear: Tympanic membrane, ear canal and external ear normal.     Nose: Nose normal. No mucosal edema or rhinorrhea.     Mouth/Throat:     Pharynx: Uvula midline. No oropharyngeal exudate.  Eyes:     Conjunctiva/sclera: Conjunctivae normal.  Neck:     Thyroid: No thyromegaly.     Trachea: Trachea normal. No tracheal tenderness or tracheal deviation.  Cardiovascular:     Rate and Rhythm: Normal rate and regular rhythm.     Heart sounds: Normal heart sounds, S1 normal and S2 normal. No murmur heard. Pulmonary:     Effort: No respiratory distress.     Breath sounds: Normal breath sounds. No stridor. No wheezing or rales.  Lymphadenopathy:     Head:     Right side of head: No tonsillar adenopathy.     Left side of head: No tonsillar adenopathy.     Cervical: No cervical adenopathy.  Skin:    Findings: Rash (Multiple excoriated papules involving arms, legs, with surrounding erythema.) present. No erythema.     Nails: There is no clubbing.  Neurological:     Mental Status: She is alert.    Diagnostics: Allergy skin tests were not performed.   Results of blood tests obtained 27 October 2019 identifies creatinine 0.61 mg/DL, AST 47 U/L, ALT 94 U/L, bilirubin 0.4 mg/DL  Assessment  and Plan:    1.  Inflammatory dermatosis   2. Pruritic disorder   3. Elevated liver function tests     1.  Blood - LFT panel, GGT, Hep B/C screen, CBC w/d, TSH, FT4, thyroid peroxidase, ANA w/R  2. Cetirizine 10 mg - 1-2 tablets 1-2 times per day (MAX=40mg /day)  3. Elidil followed by mometasone 0.1% ointment to skin lesions 1-2 times per day  4. Return to clinic in 3 weeks or earlier if problem  5. Biopsy???  [name] has some type of a pruritic inflammatory dermatosis with unknown etiologic factor and also appears to have elevated liver function tests.  Whether the 2 are related is unknown at this point.  We will have her collect the blood test noted above in investigation of these issues and she can use high-dose cetirizine and topical anti-inflammatory agents including a calcineurin inhibitor and a topical steroid to her skin lesions as noted above.  She has already been treated empirically for scabies with 2 treatments of Elimite.  If she still continues to have significant problems in the face of this therapy and we cannot identify the etiologic agent responsible for her pruritic inflammatory dermatosis then she may need a biopsy of her skin.  Jessica Priest, MD Allergy / Immunology [facility] [place]

## 2021-02-28 NOTE — Patient Instructions (Addendum)
  1.  Blood - LFT panel, GGT, Hep B/C screen, CBC w/d, TSH, FT4, thyroid peroxidase, ANA w/R  2. Cetirizine 10 mg - 1-2 tablets 1-2 times per day (MAX=40mg /day)  3. Elidil followed by mometasone 0.1% ointment to skin lesions 1-2 times per day  4. Return to clinic in 3 weeks or earlier if problem  5. Biopsy???

## 2021-02-28 NOTE — Telephone Encounter (Signed)
Submitted prior authorization for Elidel on [email or web address].  Awaiting reply.

## 2021-03-04 ENCOUNTER — Encounter: Payer: Self-pay | Admitting: Allergy and Immunology

## 2021-03-04 MED ORDER — TACROLIMUS 0.1 % EX OINT: TOPICAL_OINTMENT | CUTANEOUS | 5 refills | Status: AC

## 2021-03-04 NOTE — Telephone Encounter (Signed)
Sent prescription to [facility] for generic Protopic ( tacrolimus).  Sent MyChart message to patient informing her of change in medication.

## 2021-03-04 NOTE — Addendum Note (Signed)
Addended by: Alphonzo Cruise on: 03/04/2021 02:49 PM   Modules accepted: Orders

## 2021-03-04 NOTE — Telephone Encounter (Signed)
Prior authorization for generic Elidel denied.  Would you like to try generic Protopic 0.1%?  Looks like it is preferred over Elidel.  Please advise.

## 2021-03-04 NOTE — Telephone Encounter (Signed)
Protopic as a substitute for Elidel would be fine.

## 2021-03-05 LAB — HBV SCREENING AND DIAGNOSIS
Hep B Core Total Ab: NEGATIVE
Hep B Surface Ab, Qual: REACTIVE
Hepatitis B Surface Ag: NEGATIVE

## 2021-03-05 LAB — CBC WITH DIFFERENTIAL/PLATELET
Basophils Absolute: 0.1 10*3/uL (ref 0.0–0.2)
Basos: 1 %
EOS (ABSOLUTE): 0.1 10*3/uL (ref 0.0–0.4)
Eos: 1 %
Hematocrit: 43.6 % (ref 34.0–46.6)
Hemoglobin: 13.5 g/dL (ref 11.1–15.9)
Immature Grans (Abs): 0 10*3/uL (ref 0.0–0.1)
Immature Granulocytes: 1 %
Lymphocytes Absolute: 2.9 10*3/uL (ref 0.7–3.1)
Lymphs: 33 %
MCH: 26.9 pg (ref 26.6–33.0)
MCHC: 31 g/dL — ABNORMAL LOW (ref 31.5–35.7)
MCV: 87 fL (ref 79–97)
Monocytes Absolute: 0.5 10*3/uL (ref 0.1–0.9)
Monocytes: 5 %
Neutrophils Absolute: 5.2 10*3/uL (ref 1.4–7.0)
Neutrophils: 59 %
Platelets: 477 10*3/uL — ABNORMAL HIGH (ref 150–450)
RBC: 5.02 x10E6/uL (ref 3.77–5.28)
RDW: 13 % (ref 11.7–15.4)
WBC: 8.8 10*3/uL (ref 3.4–10.8)

## 2021-03-05 LAB — HEPATITIS C ANTIBODY: Hep C Virus Ab: <0.1 s/co ratio (ref 0.0–0.9)

## 2021-03-05 LAB — TSH+FREE T4
Free T4: 1.05 ng/dL (ref 0.82–1.77)
TSH: 0.64 u[IU]/mL (ref 0.450–4.500)

## 2021-03-05 LAB — THYROID PEROXIDASE ANTIBODY: Thyroperoxidase Ab SerPl-aCnc: <8 IU/mL (ref 0–34)

## 2021-03-05 LAB — HEPATIC FUNCTION PANEL
ALT: 72 IU/L — ABNORMAL HIGH (ref 0–32)
AST: 35 IU/L (ref 0–40)
Albumin: 4.7 g/dL (ref 3.8–4.8)
Alkaline Phosphatase: 125 IU/L — ABNORMAL HIGH (ref 44–121)
Bilirubin Total: <0.2 mg/dL (ref 0.0–1.2)
Bilirubin, Direct: <0.1 mg/dL (ref 0.00–0.40)
Total Protein: 7.2 g/dL (ref 6.0–8.5)

## 2021-03-05 LAB — ANA W/REFLEX: Anti Nuclear Antibody (ANA): NEGATIVE

## 2021-03-05 LAB — GAMMA GT: GGT: 303 IU/L — ABNORMAL HIGH (ref 0–60)

## 2021-03-14 ENCOUNTER — Other Ambulatory Visit: Payer: Self-pay | Admitting: Allergy and Immunology

## 2021-03-14 DIAGNOSIS — R7989 Other specified abnormal findings of blood chemistry: Secondary | ICD-10-CM

## 2021-03-21 ENCOUNTER — Ambulatory Visit: Payer: BC Managed Care – PPO | Admitting: Allergy and Immunology

## 2021-03-21 ENCOUNTER — Telehealth: Payer: Self-pay

## 2021-03-21 ENCOUNTER — Encounter: Payer: Self-pay | Admitting: Allergy and Immunology

## 2021-03-21 ENCOUNTER — Other Ambulatory Visit: Payer: Self-pay

## 2021-03-21 VITALS — BP 124/82 | HR 100 | Resp 16

## 2021-03-21 DIAGNOSIS — R7989 Other specified abnormal findings of blood chemistry: Secondary | ICD-10-CM | POA: Diagnosis not present

## 2021-03-21 DIAGNOSIS — L299 Pruritus, unspecified: Secondary | ICD-10-CM | POA: Diagnosis not present

## 2021-03-21 DIAGNOSIS — L989 Disorder of the skin and subcutaneous tissue, unspecified: Secondary | ICD-10-CM

## 2021-03-21 NOTE — Telephone Encounter (Signed)
Complete abdomen ultrasound has been scheduled at [facility] on 03/26/21. Patient is to arrive at 7:30 a.m for check-in and appointment is at 8:00 a.m. NPO 6 hours before appointment. Patient was informed and verbalized understanding

## 2021-03-21 NOTE — Patient Instructions (Addendum)
  1.  Awaiting results of blood tests for hepatitis. Obtain complete abdominal U/S  2. Arrange for biopsy of 'NEW' skin lesions at [facility] with special stains for dermatitis herpetiformis  3. Cetirizine 10 mg - 1-2 tablets 1-2 times per day (MAX=40mg /day)  4. Elidil followed by mometasone 0.1% ointment to skin lesions 1-2 times per day  5. Return to clinic in 12 weeks or earlier if problem

## 2021-03-21 NOTE — Progress Notes (Signed)
[place] - [place] - [place] - [place] - [place]   Follow-up Note  Referring Provider: Hadley Pen, MD Primary Provider: Hadley Pen, MD Date of Office Visit: 03/21/2021  Subjective:   Wendy Monroe (DOB: July 11, 1971) is a 50 y.o. female who [facility]/2021 in re-evaluation of the following:  HPI: Wendy Monroe returns to this clinic in evaluation of asthma and allergic rhinitis and LPR and inflammatory dermatitis and elevated liver function tests.  Her last visit to this clinic was 28 February 2021.  Her airway issue is basically stable and rarely requires her the need to undergo some type of intervention or medical use.  During her last visit we addressed the issue with her  pruritic disorder and inflammatory dermatosis.  All of her old lesions are really doing a lot better with topical therapy.  Her itchiness is doing a lot better as well.  But, she still continues to have new lesions pop up that are red and they are itchy.  Allergies as of 03/21/2021       Reactions   Erythromycin Other (See Comments)   "Pain worse than labor"   Hctz [hydrochlorothiazide] Itching   Penicillins Hives   Prednisone Other (See Comments), Itching   High doses of prednisone:"make me crazy" Other reaction(s): Other (see comments) High doses of prednisone:"make me crazy" High doses of prednisone:"make me crazy"   Sulfa Antibiotics Hives   Vivactil [protriptyline Hcl]         Medication List    Aimovig 140 MG/ML Soaj Generic drug: Erenumab-aooe SMARTSIG:140 Milligram(s) SUB-Q   albuterol 108 (90 Base) MCG/ACT inhaler Commonly known as: VENTOLIN HFA Inhale 2 puffs into the lungs every 4 (four) hours as needed.   amitriptyline 75 MG tablet Commonly known as: ELAVIL Take 75 mg by mouth at bedtime.   cyclobenzaprine 10 MG tablet Commonly known as: FLEXERIL Take 10 mg by mouth as needed for muscle spasms (prn).   diazepam 5 MG  tablet Commonly known as: VALIUM Take 1 tablet by mouth as needed.   escitalopram 20 MG tablet Commonly known as: LEXAPRO Take 20 mg by mouth at bedtime.   hydrOXYzine 10 MG tablet Commonly known as: ATARAX/VISTARIL Take 10 mg by mouth 3 (three) times daily as needed.   hydrOXYzine 10 MG tablet Commonly known as: ATARAX/VISTARIL Take by mouth.   KLONOPIN PO Take 10 mg by mouth as needed.   levothyroxine 100 MCG tablet Commonly known as: SYNTHROID Take 100 mcg by mouth daily.   metFORMIN 1000 MG tablet Commonly known as: GLUCOPHAGE Take 1,000 mg by mouth 2 (two) times daily.   mometasone 0.1 % ointment Commonly known as: ELOCON Apply to skin lesions one to two times daily as directed.   montelukast 10 MG tablet Commonly known as: SINGULAIR Take 10 mg by mouth daily.   norethindrone 0.35 MG tablet Commonly known as: MICRONOR Take 1 tablet by mouth daily.   Ozempic (0.25 or 0.5 MG/DOSE) 2 MG/1.5ML Sopn Generic drug: Semaglutide(0.25 or 0.5MG /DOS) Inject 0.5 mg into the skin once a week.   pantoprazole 40 MG tablet Commonly known as: PROTONIX Take 1 tablet by mouth every morning.   rizatriptan 10 MG disintegrating tablet Commonly known as: Maxalt-MLT Take 1 tablet (10 mg total) by mouth as needed. May repeat in 2 hours if needed   tacrolimus 0.1 % ointment Commonly known as: Protopic Apply to skin lesions one to two times per day as directed.  triamcinolone cream 0.1 % Commonly known as: KENALOG SMARTSIG:Topical 1-2 Times Daily PRN   venlafaxine XR 150 MG 24 hr capsule Commonly known as: EFFEXOR-XR Take 150 mg by mouth 2 (two) times daily.   Vitamin D (Ergocalciferol) 1.25 MG (50000 UNIT) Caps capsule Commonly known as: DRISDOL Take 50,000 Units by mouth once a week.   ZYRTEC PO Take 10 mg by mouth as needed.        Past Medical History:  Diagnosis Date   Anxiety    Asthma    Chronic neck pain    Depression    Diabetes (HCC)    Family  history of adverse reaction to anesthesia    Mother had PONV   GERD (gastroesophageal reflux disease)    Headache    Migraines   Migraine    Numbness and tingling of right arm    PONV (postoperative nausea and vomiting)    Thyroid disease    Wears glasses     Past Surgical History:  Procedure Laterality Date   POSTERIOR CERVICAL FUSION/FORAMINOTOMY N/A 07/19/2014   Procedure: POSTERIOR CERVICAL FUSION/FORAMINOTOMY CERVICAL ONE-TWO WITH INSTRUMENTATION;  Surgeon: Tressie Stalker, MD;  Location: [facility];  Service: Neurosurgery;  Laterality: N/A;   WISDOM TOOTH EXTRACTION      Review of systems negative except as noted in HPI / PMHx or noted below:  Review of Systems  Constitutional: Negative.   HENT: Negative.    Eyes: Negative.   Respiratory: Negative.    Cardiovascular: Negative.   Gastrointestinal: Negative.   Genitourinary: Negative.   Musculoskeletal: Negative.   Skin: Negative.   Neurological: Negative.   Endo/Heme/Allergies: Negative.   Psychiatric/Behavioral: Negative.      Objective:   Vitals:   03/21/21 1609  BP: 124/82  Pulse: 100  Resp: 16  SpO2: 99%           Physical Exam Skin:    Findings: Rash (Multiple well-healed hyperpigmented papules involving arms and legs.  Approximately 12 erythematous slightly blanching 4 mm diameter lesions affecting arms and legs.) present.    Diagnostics:   Results of blood tests obtained 01 March 2021 identifies AST 35U/L, ALT 72U/L, alkaline phosphatase 125U/L, GGT 303U/L, negative hepatitis B surface antigen, positive hepatitis B surface antibody, negative hepatitis C antibody, WBC 8.8, absolute eosinophil 100, absolute basophil 100, lymphocyte 2900, hemoglobin 13.5, platelet 477 TSH 0.640 IU/mL, free T4 1.05 NG/DL, thyroid peroxidase antibody less than 8 EU/mL  Assessment and Plan:   1. Inflammatory dermatosis   2. Pruritic disorder   3. Elevated liver function tests     1.  Awaiting results of blood  tests for hepatitis. Obtain complete abdominal U/S  2. Arrange for biopsy of 'NEW' skin lesions at [facility] with special stains for dermatitis herpetiformis  3. Cetirizine 10 mg - 1-2 tablets 1-2 times per day (MAX=40mg /day)  4. Elidil followed by mometasone 0.1% ointment to skin lesions 1-2 times per day  5. Return to clinic in 12 weeks or earlier if problem   Although Wendy Monroe is somewhat better regarding the inflammatory component of her lesions with her combination of Elidel and mometasone and her pruritic disorder is better with cetirizine she still has new lesions that are cropping up.  I think it be best to get a skin biopsy of these new lesions with special stains for dermatitis herpetiformis and we will get that arranged with [place] [facility] at some point in the near future.  She also has persistently elevated liver function tests.  We are awaiting antibodies and assessment of possible autoimmune hepatitis and we will obtain a complete abdominal ultrasound.  Hopefully this whole issue is just tied up with fatty liver.  Laurette Schimke, MD Allergy / Immunology [facility]

## 2021-03-22 LAB — ANTI-SMOOTH MUSCLE ANTIBODY, IGG: Smooth Muscle Ab: 2 Units (ref 0–19)

## 2021-03-22 LAB — ANTI-MICROSOMAL ANTIBODY LIVER / KIDNEY: LKM1 Ab: 0.7 Units (ref 0.0–20.0)

## 2021-03-22 LAB — FERRITIN: Ferritin: 50 ng/mL (ref 15–150)

## 2021-03-22 LAB — SOLUBLE LIVER AG (IGG AB): Anti-SLA, IgG: 0.6 units (ref 0.0–20.0)

## 2021-03-22 LAB — MITOCHONDRIAL ANTIBODIES: Mitochondrial Ab: <20 Units (ref 0.0–20.0)

## 2021-03-25 ENCOUNTER — Encounter: Payer: Self-pay | Admitting: Allergy and Immunology

## 2021-03-28 ENCOUNTER — Telehealth: Payer: Self-pay

## 2021-03-28 NOTE — Telephone Encounter (Signed)
Referral to Dermatology was faxed over on 03/25/2021. Patient reached out to office and she was scheduled for 03/29/2021 at the [facility]

## 2021-04-04 ENCOUNTER — Encounter: Payer: Self-pay | Admitting: *Deleted

## 2021-06-13 ENCOUNTER — Ambulatory Visit: Payer: BC Managed Care – PPO | Admitting: Allergy and Immunology

## 2022-03-05 DIAGNOSIS — R1900 Intra-abdominal and pelvic swelling, mass and lump, unspecified site: Secondary | ICD-10-CM | POA: Diagnosis not present

## 2022-03-05 DIAGNOSIS — R310 Gross hematuria: Secondary | ICD-10-CM | POA: Diagnosis not present

## 2022-03-05 DIAGNOSIS — N3001 Acute cystitis with hematuria: Secondary | ICD-10-CM | POA: Diagnosis not present

## 2022-03-19 DIAGNOSIS — G43709 Chronic migraine without aura, not intractable, without status migrainosus: Secondary | ICD-10-CM | POA: Diagnosis not present

## 2022-03-21 DIAGNOSIS — R112 Nausea with vomiting, unspecified: Secondary | ICD-10-CM | POA: Diagnosis not present

## 2022-03-21 DIAGNOSIS — R519 Headache, unspecified: Secondary | ICD-10-CM | POA: Diagnosis not present

## 2022-03-31 DIAGNOSIS — M5387 Other specified dorsopathies, lumbosacral region: Secondary | ICD-10-CM | POA: Diagnosis not present

## 2022-03-31 DIAGNOSIS — M9905 Segmental and somatic dysfunction of pelvic region: Secondary | ICD-10-CM | POA: Diagnosis not present

## 2022-03-31 DIAGNOSIS — M9903 Segmental and somatic dysfunction of lumbar region: Secondary | ICD-10-CM | POA: Diagnosis not present

## 2022-03-31 DIAGNOSIS — M461 Sacroiliitis, not elsewhere classified: Secondary | ICD-10-CM | POA: Diagnosis not present

## 2022-04-07 DIAGNOSIS — M461 Sacroiliitis, not elsewhere classified: Secondary | ICD-10-CM | POA: Diagnosis not present

## 2022-04-07 DIAGNOSIS — M5387 Other specified dorsopathies, lumbosacral region: Secondary | ICD-10-CM | POA: Diagnosis not present

## 2022-04-07 DIAGNOSIS — R748 Abnormal levels of other serum enzymes: Secondary | ICD-10-CM | POA: Diagnosis not present

## 2022-04-07 DIAGNOSIS — R109 Unspecified abdominal pain: Secondary | ICD-10-CM | POA: Diagnosis not present

## 2022-04-07 DIAGNOSIS — K5900 Constipation, unspecified: Secondary | ICD-10-CM | POA: Diagnosis not present

## 2022-04-07 DIAGNOSIS — M9905 Segmental and somatic dysfunction of pelvic region: Secondary | ICD-10-CM | POA: Diagnosis not present

## 2022-04-07 DIAGNOSIS — M9903 Segmental and somatic dysfunction of lumbar region: Secondary | ICD-10-CM | POA: Diagnosis not present

## 2022-04-07 DIAGNOSIS — I10 Essential (primary) hypertension: Secondary | ICD-10-CM | POA: Diagnosis not present

## 2022-04-07 DIAGNOSIS — E1165 Type 2 diabetes mellitus with hyperglycemia: Secondary | ICD-10-CM | POA: Diagnosis not present

## 2022-04-18 DIAGNOSIS — N200 Calculus of kidney: Secondary | ICD-10-CM | POA: Diagnosis not present

## 2022-04-18 DIAGNOSIS — R109 Unspecified abdominal pain: Secondary | ICD-10-CM | POA: Diagnosis not present

## 2022-04-18 DIAGNOSIS — R7989 Other specified abnormal findings of blood chemistry: Secondary | ICD-10-CM | POA: Diagnosis not present

## 2022-04-18 DIAGNOSIS — K760 Fatty (change of) liver, not elsewhere classified: Secondary | ICD-10-CM | POA: Diagnosis not present

## 2022-04-18 DIAGNOSIS — R748 Abnormal levels of other serum enzymes: Secondary | ICD-10-CM | POA: Diagnosis not present

## 2022-04-18 DIAGNOSIS — R160 Hepatomegaly, not elsewhere classified: Secondary | ICD-10-CM | POA: Diagnosis not present

## 2022-04-23 DIAGNOSIS — R7989 Other specified abnormal findings of blood chemistry: Secondary | ICD-10-CM | POA: Diagnosis not present

## 2022-04-23 DIAGNOSIS — R748 Abnormal levels of other serum enzymes: Secondary | ICD-10-CM | POA: Diagnosis not present

## 2022-05-27 DIAGNOSIS — M9901 Segmental and somatic dysfunction of cervical region: Secondary | ICD-10-CM | POA: Diagnosis not present

## 2022-05-27 DIAGNOSIS — M9905 Segmental and somatic dysfunction of pelvic region: Secondary | ICD-10-CM | POA: Diagnosis not present

## 2022-05-27 DIAGNOSIS — M461 Sacroiliitis, not elsewhere classified: Secondary | ICD-10-CM | POA: Diagnosis not present

## 2022-05-27 DIAGNOSIS — M7912 Myalgia of auxiliary muscles, head and neck: Secondary | ICD-10-CM | POA: Diagnosis not present

## 2022-05-28 DIAGNOSIS — E1165 Type 2 diabetes mellitus with hyperglycemia: Secondary | ICD-10-CM | POA: Diagnosis not present

## 2022-05-28 DIAGNOSIS — E039 Hypothyroidism, unspecified: Secondary | ICD-10-CM | POA: Diagnosis not present

## 2022-05-28 DIAGNOSIS — E538 Deficiency of other specified B group vitamins: Secondary | ICD-10-CM | POA: Diagnosis not present

## 2022-05-28 DIAGNOSIS — E785 Hyperlipidemia, unspecified: Secondary | ICD-10-CM | POA: Diagnosis not present

## 2022-05-28 DIAGNOSIS — K3184 Gastroparesis: Secondary | ICD-10-CM | POA: Diagnosis not present

## 2022-05-29 DIAGNOSIS — M7912 Myalgia of auxiliary muscles, head and neck: Secondary | ICD-10-CM | POA: Diagnosis not present

## 2022-05-29 DIAGNOSIS — M9905 Segmental and somatic dysfunction of pelvic region: Secondary | ICD-10-CM | POA: Diagnosis not present

## 2022-05-29 DIAGNOSIS — M9901 Segmental and somatic dysfunction of cervical region: Secondary | ICD-10-CM | POA: Diagnosis not present

## 2022-05-29 DIAGNOSIS — M461 Sacroiliitis, not elsewhere classified: Secondary | ICD-10-CM | POA: Diagnosis not present

## 2022-06-02 DIAGNOSIS — M7912 Myalgia of auxiliary muscles, head and neck: Secondary | ICD-10-CM | POA: Diagnosis not present

## 2022-06-02 DIAGNOSIS — M9905 Segmental and somatic dysfunction of pelvic region: Secondary | ICD-10-CM | POA: Diagnosis not present

## 2022-06-02 DIAGNOSIS — M461 Sacroiliitis, not elsewhere classified: Secondary | ICD-10-CM | POA: Diagnosis not present

## 2022-06-02 DIAGNOSIS — M9901 Segmental and somatic dysfunction of cervical region: Secondary | ICD-10-CM | POA: Diagnosis not present

## 2022-06-04 DIAGNOSIS — M542 Cervicalgia: Secondary | ICD-10-CM | POA: Diagnosis not present

## 2022-06-04 DIAGNOSIS — M50322 Other cervical disc degeneration at C5-C6 level: Secondary | ICD-10-CM | POA: Diagnosis not present

## 2022-06-09 DIAGNOSIS — M9901 Segmental and somatic dysfunction of cervical region: Secondary | ICD-10-CM | POA: Diagnosis not present

## 2022-06-09 DIAGNOSIS — M461 Sacroiliitis, not elsewhere classified: Secondary | ICD-10-CM | POA: Diagnosis not present

## 2022-06-09 DIAGNOSIS — M7912 Myalgia of auxiliary muscles, head and neck: Secondary | ICD-10-CM | POA: Diagnosis not present

## 2022-06-09 DIAGNOSIS — M9905 Segmental and somatic dysfunction of pelvic region: Secondary | ICD-10-CM | POA: Diagnosis not present

## 2022-06-12 DIAGNOSIS — M7912 Myalgia of auxiliary muscles, head and neck: Secondary | ICD-10-CM | POA: Diagnosis not present

## 2022-06-12 DIAGNOSIS — M9905 Segmental and somatic dysfunction of pelvic region: Secondary | ICD-10-CM | POA: Diagnosis not present

## 2022-06-12 DIAGNOSIS — M9901 Segmental and somatic dysfunction of cervical region: Secondary | ICD-10-CM | POA: Diagnosis not present

## 2022-06-12 DIAGNOSIS — M461 Sacroiliitis, not elsewhere classified: Secondary | ICD-10-CM | POA: Diagnosis not present

## 2022-06-17 DIAGNOSIS — G43709 Chronic migraine without aura, not intractable, without status migrainosus: Secondary | ICD-10-CM | POA: Diagnosis not present

## 2022-06-19 DIAGNOSIS — M9901 Segmental and somatic dysfunction of cervical region: Secondary | ICD-10-CM | POA: Diagnosis not present

## 2022-06-19 DIAGNOSIS — M9905 Segmental and somatic dysfunction of pelvic region: Secondary | ICD-10-CM | POA: Diagnosis not present

## 2022-06-19 DIAGNOSIS — M461 Sacroiliitis, not elsewhere classified: Secondary | ICD-10-CM | POA: Diagnosis not present

## 2022-06-19 DIAGNOSIS — M7912 Myalgia of auxiliary muscles, head and neck: Secondary | ICD-10-CM | POA: Diagnosis not present

## 2022-06-23 DIAGNOSIS — M7912 Myalgia of auxiliary muscles, head and neck: Secondary | ICD-10-CM | POA: Diagnosis not present

## 2022-06-23 DIAGNOSIS — M9905 Segmental and somatic dysfunction of pelvic region: Secondary | ICD-10-CM | POA: Diagnosis not present

## 2022-06-23 DIAGNOSIS — M461 Sacroiliitis, not elsewhere classified: Secondary | ICD-10-CM | POA: Diagnosis not present

## 2022-06-23 DIAGNOSIS — M9901 Segmental and somatic dysfunction of cervical region: Secondary | ICD-10-CM | POA: Diagnosis not present

## 2022-06-25 DIAGNOSIS — M9901 Segmental and somatic dysfunction of cervical region: Secondary | ICD-10-CM | POA: Diagnosis not present

## 2022-06-25 DIAGNOSIS — M461 Sacroiliitis, not elsewhere classified: Secondary | ICD-10-CM | POA: Diagnosis not present

## 2022-06-25 DIAGNOSIS — M9905 Segmental and somatic dysfunction of pelvic region: Secondary | ICD-10-CM | POA: Diagnosis not present

## 2022-06-25 DIAGNOSIS — M7912 Myalgia of auxiliary muscles, head and neck: Secondary | ICD-10-CM | POA: Diagnosis not present

## 2022-06-30 DIAGNOSIS — M9905 Segmental and somatic dysfunction of pelvic region: Secondary | ICD-10-CM | POA: Diagnosis not present

## 2022-06-30 DIAGNOSIS — M9901 Segmental and somatic dysfunction of cervical region: Secondary | ICD-10-CM | POA: Diagnosis not present

## 2022-06-30 DIAGNOSIS — M461 Sacroiliitis, not elsewhere classified: Secondary | ICD-10-CM | POA: Diagnosis not present

## 2022-06-30 DIAGNOSIS — M7912 Myalgia of auxiliary muscles, head and neck: Secondary | ICD-10-CM | POA: Diagnosis not present

## 2022-07-09 DIAGNOSIS — M461 Sacroiliitis, not elsewhere classified: Secondary | ICD-10-CM | POA: Diagnosis not present

## 2022-07-09 DIAGNOSIS — M9901 Segmental and somatic dysfunction of cervical region: Secondary | ICD-10-CM | POA: Diagnosis not present

## 2022-07-09 DIAGNOSIS — M7912 Myalgia of auxiliary muscles, head and neck: Secondary | ICD-10-CM | POA: Diagnosis not present

## 2022-07-09 DIAGNOSIS — M9905 Segmental and somatic dysfunction of pelvic region: Secondary | ICD-10-CM | POA: Diagnosis not present

## 2022-07-21 DIAGNOSIS — M9901 Segmental and somatic dysfunction of cervical region: Secondary | ICD-10-CM | POA: Diagnosis not present

## 2022-07-21 DIAGNOSIS — M7912 Myalgia of auxiliary muscles, head and neck: Secondary | ICD-10-CM | POA: Diagnosis not present

## 2022-07-21 DIAGNOSIS — M9905 Segmental and somatic dysfunction of pelvic region: Secondary | ICD-10-CM | POA: Diagnosis not present

## 2022-07-21 DIAGNOSIS — M461 Sacroiliitis, not elsewhere classified: Secondary | ICD-10-CM | POA: Diagnosis not present

## 2022-07-28 DIAGNOSIS — M9901 Segmental and somatic dysfunction of cervical region: Secondary | ICD-10-CM | POA: Diagnosis not present

## 2022-07-28 DIAGNOSIS — M461 Sacroiliitis, not elsewhere classified: Secondary | ICD-10-CM | POA: Diagnosis not present

## 2022-07-28 DIAGNOSIS — M7912 Myalgia of auxiliary muscles, head and neck: Secondary | ICD-10-CM | POA: Diagnosis not present

## 2022-07-28 DIAGNOSIS — M9905 Segmental and somatic dysfunction of pelvic region: Secondary | ICD-10-CM | POA: Diagnosis not present

## 2022-07-30 DIAGNOSIS — M9905 Segmental and somatic dysfunction of pelvic region: Secondary | ICD-10-CM | POA: Diagnosis not present

## 2022-07-30 DIAGNOSIS — M7912 Myalgia of auxiliary muscles, head and neck: Secondary | ICD-10-CM | POA: Diagnosis not present

## 2022-07-30 DIAGNOSIS — M9901 Segmental and somatic dysfunction of cervical region: Secondary | ICD-10-CM | POA: Diagnosis not present

## 2022-07-30 DIAGNOSIS — M461 Sacroiliitis, not elsewhere classified: Secondary | ICD-10-CM | POA: Diagnosis not present

## 2022-08-18 DIAGNOSIS — M9905 Segmental and somatic dysfunction of pelvic region: Secondary | ICD-10-CM | POA: Diagnosis not present

## 2022-08-18 DIAGNOSIS — M7912 Myalgia of auxiliary muscles, head and neck: Secondary | ICD-10-CM | POA: Diagnosis not present

## 2022-08-18 DIAGNOSIS — M9901 Segmental and somatic dysfunction of cervical region: Secondary | ICD-10-CM | POA: Diagnosis not present

## 2022-08-18 DIAGNOSIS — M461 Sacroiliitis, not elsewhere classified: Secondary | ICD-10-CM | POA: Diagnosis not present

## 2022-08-28 DIAGNOSIS — E785 Hyperlipidemia, unspecified: Secondary | ICD-10-CM | POA: Diagnosis not present

## 2022-08-28 DIAGNOSIS — E1165 Type 2 diabetes mellitus with hyperglycemia: Secondary | ICD-10-CM | POA: Diagnosis not present

## 2022-08-28 DIAGNOSIS — E559 Vitamin D deficiency, unspecified: Secondary | ICD-10-CM | POA: Diagnosis not present

## 2022-08-28 DIAGNOSIS — E039 Hypothyroidism, unspecified: Secondary | ICD-10-CM | POA: Diagnosis not present

## 2022-09-01 DIAGNOSIS — M9905 Segmental and somatic dysfunction of pelvic region: Secondary | ICD-10-CM | POA: Diagnosis not present

## 2022-09-01 DIAGNOSIS — M9901 Segmental and somatic dysfunction of cervical region: Secondary | ICD-10-CM | POA: Diagnosis not present

## 2022-09-01 DIAGNOSIS — M461 Sacroiliitis, not elsewhere classified: Secondary | ICD-10-CM | POA: Diagnosis not present

## 2022-09-01 DIAGNOSIS — M7912 Myalgia of auxiliary muscles, head and neck: Secondary | ICD-10-CM | POA: Diagnosis not present

## 2022-09-04 DIAGNOSIS — E1165 Type 2 diabetes mellitus with hyperglycemia: Secondary | ICD-10-CM | POA: Diagnosis not present

## 2022-09-04 DIAGNOSIS — J4531 Mild persistent asthma with (acute) exacerbation: Secondary | ICD-10-CM | POA: Diagnosis not present

## 2022-09-15 DIAGNOSIS — M9901 Segmental and somatic dysfunction of cervical region: Secondary | ICD-10-CM | POA: Diagnosis not present

## 2022-09-15 DIAGNOSIS — M9905 Segmental and somatic dysfunction of pelvic region: Secondary | ICD-10-CM | POA: Diagnosis not present

## 2022-09-15 DIAGNOSIS — M461 Sacroiliitis, not elsewhere classified: Secondary | ICD-10-CM | POA: Diagnosis not present

## 2022-09-15 DIAGNOSIS — M7912 Myalgia of auxiliary muscles, head and neck: Secondary | ICD-10-CM | POA: Diagnosis not present

## 2022-09-16 DIAGNOSIS — G43109 Migraine with aura, not intractable, without status migrainosus: Secondary | ICD-10-CM | POA: Diagnosis not present

## 2022-10-09 DIAGNOSIS — M461 Sacroiliitis, not elsewhere classified: Secondary | ICD-10-CM | POA: Diagnosis not present

## 2022-10-09 DIAGNOSIS — M9901 Segmental and somatic dysfunction of cervical region: Secondary | ICD-10-CM | POA: Diagnosis not present

## 2022-10-09 DIAGNOSIS — M9905 Segmental and somatic dysfunction of pelvic region: Secondary | ICD-10-CM | POA: Diagnosis not present

## 2022-10-09 DIAGNOSIS — M7912 Myalgia of auxiliary muscles, head and neck: Secondary | ICD-10-CM | POA: Diagnosis not present

## 2022-10-30 DIAGNOSIS — M7912 Myalgia of auxiliary muscles, head and neck: Secondary | ICD-10-CM | POA: Diagnosis not present

## 2022-10-30 DIAGNOSIS — M461 Sacroiliitis, not elsewhere classified: Secondary | ICD-10-CM | POA: Diagnosis not present

## 2022-10-30 DIAGNOSIS — M9901 Segmental and somatic dysfunction of cervical region: Secondary | ICD-10-CM | POA: Diagnosis not present

## 2022-10-30 DIAGNOSIS — M9905 Segmental and somatic dysfunction of pelvic region: Secondary | ICD-10-CM | POA: Diagnosis not present

## 2022-12-01 DIAGNOSIS — R051 Acute cough: Secondary | ICD-10-CM | POA: Diagnosis not present

## 2022-12-01 DIAGNOSIS — J22 Unspecified acute lower respiratory infection: Secondary | ICD-10-CM | POA: Diagnosis not present

## 2022-12-15 DIAGNOSIS — G43709 Chronic migraine without aura, not intractable, without status migrainosus: Secondary | ICD-10-CM | POA: Diagnosis not present

## 2022-12-18 ENCOUNTER — Other Ambulatory Visit (HOSPITAL_BASED_OUTPATIENT_CLINIC_OR_DEPARTMENT_OTHER): Payer: Self-pay

## 2022-12-18 DIAGNOSIS — M461 Sacroiliitis, not elsewhere classified: Secondary | ICD-10-CM | POA: Diagnosis not present

## 2022-12-18 DIAGNOSIS — M7912 Myalgia of auxiliary muscles, head and neck: Secondary | ICD-10-CM | POA: Diagnosis not present

## 2022-12-18 DIAGNOSIS — M9905 Segmental and somatic dysfunction of pelvic region: Secondary | ICD-10-CM | POA: Diagnosis not present

## 2022-12-18 DIAGNOSIS — M9901 Segmental and somatic dysfunction of cervical region: Secondary | ICD-10-CM | POA: Diagnosis not present

## 2023-01-01 DIAGNOSIS — M9905 Segmental and somatic dysfunction of pelvic region: Secondary | ICD-10-CM | POA: Diagnosis not present

## 2023-01-01 DIAGNOSIS — M7912 Myalgia of auxiliary muscles, head and neck: Secondary | ICD-10-CM | POA: Diagnosis not present

## 2023-01-01 DIAGNOSIS — M461 Sacroiliitis, not elsewhere classified: Secondary | ICD-10-CM | POA: Diagnosis not present

## 2023-01-01 DIAGNOSIS — M9901 Segmental and somatic dysfunction of cervical region: Secondary | ICD-10-CM | POA: Diagnosis not present

## 2023-01-20 DIAGNOSIS — M7912 Myalgia of auxiliary muscles, head and neck: Secondary | ICD-10-CM | POA: Diagnosis not present

## 2023-01-20 DIAGNOSIS — M9901 Segmental and somatic dysfunction of cervical region: Secondary | ICD-10-CM | POA: Diagnosis not present

## 2023-01-20 DIAGNOSIS — M9905 Segmental and somatic dysfunction of pelvic region: Secondary | ICD-10-CM | POA: Diagnosis not present

## 2023-01-20 DIAGNOSIS — M461 Sacroiliitis, not elsewhere classified: Secondary | ICD-10-CM | POA: Diagnosis not present

## 2023-01-22 DIAGNOSIS — M542 Cervicalgia: Secondary | ICD-10-CM | POA: Diagnosis not present

## 2023-01-22 DIAGNOSIS — E1143 Type 2 diabetes mellitus with diabetic autonomic (poly)neuropathy: Secondary | ICD-10-CM | POA: Diagnosis not present

## 2023-02-03 DIAGNOSIS — M7912 Myalgia of auxiliary muscles, head and neck: Secondary | ICD-10-CM | POA: Diagnosis not present

## 2023-02-03 DIAGNOSIS — M9905 Segmental and somatic dysfunction of pelvic region: Secondary | ICD-10-CM | POA: Diagnosis not present

## 2023-02-03 DIAGNOSIS — M461 Sacroiliitis, not elsewhere classified: Secondary | ICD-10-CM | POA: Diagnosis not present

## 2023-02-03 DIAGNOSIS — M9901 Segmental and somatic dysfunction of cervical region: Secondary | ICD-10-CM | POA: Diagnosis not present

## 2023-02-10 DIAGNOSIS — M9901 Segmental and somatic dysfunction of cervical region: Secondary | ICD-10-CM | POA: Diagnosis not present

## 2023-02-10 DIAGNOSIS — M9905 Segmental and somatic dysfunction of pelvic region: Secondary | ICD-10-CM | POA: Diagnosis not present

## 2023-02-10 DIAGNOSIS — M7912 Myalgia of auxiliary muscles, head and neck: Secondary | ICD-10-CM | POA: Diagnosis not present

## 2023-02-10 DIAGNOSIS — M461 Sacroiliitis, not elsewhere classified: Secondary | ICD-10-CM | POA: Diagnosis not present

## 2023-02-11 DIAGNOSIS — R350 Frequency of micturition: Secondary | ICD-10-CM | POA: Diagnosis not present

## 2023-02-17 DIAGNOSIS — M9901 Segmental and somatic dysfunction of cervical region: Secondary | ICD-10-CM | POA: Diagnosis not present

## 2023-02-17 DIAGNOSIS — M7912 Myalgia of auxiliary muscles, head and neck: Secondary | ICD-10-CM | POA: Diagnosis not present

## 2023-02-17 DIAGNOSIS — M9905 Segmental and somatic dysfunction of pelvic region: Secondary | ICD-10-CM | POA: Diagnosis not present

## 2023-02-17 DIAGNOSIS — M461 Sacroiliitis, not elsewhere classified: Secondary | ICD-10-CM | POA: Diagnosis not present

## 2023-02-20 DIAGNOSIS — E039 Hypothyroidism, unspecified: Secondary | ICD-10-CM | POA: Diagnosis not present

## 2023-02-20 DIAGNOSIS — E1165 Type 2 diabetes mellitus with hyperglycemia: Secondary | ICD-10-CM | POA: Diagnosis not present

## 2023-02-20 DIAGNOSIS — E559 Vitamin D deficiency, unspecified: Secondary | ICD-10-CM | POA: Diagnosis not present

## 2023-02-20 DIAGNOSIS — E538 Deficiency of other specified B group vitamins: Secondary | ICD-10-CM | POA: Diagnosis not present

## 2023-02-20 DIAGNOSIS — E785 Hyperlipidemia, unspecified: Secondary | ICD-10-CM | POA: Diagnosis not present

## 2023-02-24 DIAGNOSIS — M7912 Myalgia of auxiliary muscles, head and neck: Secondary | ICD-10-CM | POA: Diagnosis not present

## 2023-02-24 DIAGNOSIS — M461 Sacroiliitis, not elsewhere classified: Secondary | ICD-10-CM | POA: Diagnosis not present

## 2023-02-24 DIAGNOSIS — M9901 Segmental and somatic dysfunction of cervical region: Secondary | ICD-10-CM | POA: Diagnosis not present

## 2023-02-24 DIAGNOSIS — M9905 Segmental and somatic dysfunction of pelvic region: Secondary | ICD-10-CM | POA: Diagnosis not present

## 2023-03-02 DIAGNOSIS — M7912 Myalgia of auxiliary muscles, head and neck: Secondary | ICD-10-CM | POA: Diagnosis not present

## 2023-03-02 DIAGNOSIS — M461 Sacroiliitis, not elsewhere classified: Secondary | ICD-10-CM | POA: Diagnosis not present

## 2023-03-02 DIAGNOSIS — M9901 Segmental and somatic dysfunction of cervical region: Secondary | ICD-10-CM | POA: Diagnosis not present

## 2023-03-02 DIAGNOSIS — M9905 Segmental and somatic dysfunction of pelvic region: Secondary | ICD-10-CM | POA: Diagnosis not present

## 2023-03-11 DIAGNOSIS — M7912 Myalgia of auxiliary muscles, head and neck: Secondary | ICD-10-CM | POA: Diagnosis not present

## 2023-03-11 DIAGNOSIS — M9905 Segmental and somatic dysfunction of pelvic region: Secondary | ICD-10-CM | POA: Diagnosis not present

## 2023-03-11 DIAGNOSIS — M9901 Segmental and somatic dysfunction of cervical region: Secondary | ICD-10-CM | POA: Diagnosis not present

## 2023-03-11 DIAGNOSIS — M461 Sacroiliitis, not elsewhere classified: Secondary | ICD-10-CM | POA: Diagnosis not present

## 2023-03-17 DIAGNOSIS — G43709 Chronic migraine without aura, not intractable, without status migrainosus: Secondary | ICD-10-CM | POA: Diagnosis not present

## 2023-03-18 DIAGNOSIS — M9901 Segmental and somatic dysfunction of cervical region: Secondary | ICD-10-CM | POA: Diagnosis not present

## 2023-03-18 DIAGNOSIS — M9905 Segmental and somatic dysfunction of pelvic region: Secondary | ICD-10-CM | POA: Diagnosis not present

## 2023-03-18 DIAGNOSIS — M7912 Myalgia of auxiliary muscles, head and neck: Secondary | ICD-10-CM | POA: Diagnosis not present

## 2023-03-18 DIAGNOSIS — M461 Sacroiliitis, not elsewhere classified: Secondary | ICD-10-CM | POA: Diagnosis not present

## 2023-03-26 DIAGNOSIS — M461 Sacroiliitis, not elsewhere classified: Secondary | ICD-10-CM | POA: Diagnosis not present

## 2023-03-26 DIAGNOSIS — M7912 Myalgia of auxiliary muscles, head and neck: Secondary | ICD-10-CM | POA: Diagnosis not present

## 2023-03-26 DIAGNOSIS — M9901 Segmental and somatic dysfunction of cervical region: Secondary | ICD-10-CM | POA: Diagnosis not present

## 2023-03-26 DIAGNOSIS — M9905 Segmental and somatic dysfunction of pelvic region: Secondary | ICD-10-CM | POA: Diagnosis not present

## 2023-04-09 DIAGNOSIS — M461 Sacroiliitis, not elsewhere classified: Secondary | ICD-10-CM | POA: Diagnosis not present

## 2023-04-09 DIAGNOSIS — M7912 Myalgia of auxiliary muscles, head and neck: Secondary | ICD-10-CM | POA: Diagnosis not present

## 2023-04-09 DIAGNOSIS — M9901 Segmental and somatic dysfunction of cervical region: Secondary | ICD-10-CM | POA: Diagnosis not present

## 2023-04-09 DIAGNOSIS — M9905 Segmental and somatic dysfunction of pelvic region: Secondary | ICD-10-CM | POA: Diagnosis not present

## 2023-05-05 DIAGNOSIS — M9905 Segmental and somatic dysfunction of pelvic region: Secondary | ICD-10-CM | POA: Diagnosis not present

## 2023-05-05 DIAGNOSIS — M9901 Segmental and somatic dysfunction of cervical region: Secondary | ICD-10-CM | POA: Diagnosis not present

## 2023-05-05 DIAGNOSIS — M7912 Myalgia of auxiliary muscles, head and neck: Secondary | ICD-10-CM | POA: Diagnosis not present

## 2023-05-05 DIAGNOSIS — M461 Sacroiliitis, not elsewhere classified: Secondary | ICD-10-CM | POA: Diagnosis not present

## 2023-05-20 DIAGNOSIS — M9905 Segmental and somatic dysfunction of pelvic region: Secondary | ICD-10-CM | POA: Diagnosis not present

## 2023-05-20 DIAGNOSIS — M9901 Segmental and somatic dysfunction of cervical region: Secondary | ICD-10-CM | POA: Diagnosis not present

## 2023-05-20 DIAGNOSIS — M7912 Myalgia of auxiliary muscles, head and neck: Secondary | ICD-10-CM | POA: Diagnosis not present

## 2023-05-20 DIAGNOSIS — M461 Sacroiliitis, not elsewhere classified: Secondary | ICD-10-CM | POA: Diagnosis not present

## 2023-05-28 DIAGNOSIS — M7912 Myalgia of auxiliary muscles, head and neck: Secondary | ICD-10-CM | POA: Diagnosis not present

## 2023-05-28 DIAGNOSIS — M9901 Segmental and somatic dysfunction of cervical region: Secondary | ICD-10-CM | POA: Diagnosis not present

## 2023-05-28 DIAGNOSIS — M9905 Segmental and somatic dysfunction of pelvic region: Secondary | ICD-10-CM | POA: Diagnosis not present

## 2023-05-28 DIAGNOSIS — J019 Acute sinusitis, unspecified: Secondary | ICD-10-CM | POA: Diagnosis not present

## 2023-05-28 DIAGNOSIS — M461 Sacroiliitis, not elsewhere classified: Secondary | ICD-10-CM | POA: Diagnosis not present

## 2023-06-01 DIAGNOSIS — E559 Vitamin D deficiency, unspecified: Secondary | ICD-10-CM | POA: Diagnosis not present

## 2023-06-01 DIAGNOSIS — E039 Hypothyroidism, unspecified: Secondary | ICD-10-CM | POA: Diagnosis not present

## 2023-06-01 DIAGNOSIS — E785 Hyperlipidemia, unspecified: Secondary | ICD-10-CM | POA: Diagnosis not present

## 2023-06-01 DIAGNOSIS — E1165 Type 2 diabetes mellitus with hyperglycemia: Secondary | ICD-10-CM | POA: Diagnosis not present

## 2023-06-04 DIAGNOSIS — M7912 Myalgia of auxiliary muscles, head and neck: Secondary | ICD-10-CM | POA: Diagnosis not present

## 2023-06-04 DIAGNOSIS — M461 Sacroiliitis, not elsewhere classified: Secondary | ICD-10-CM | POA: Diagnosis not present

## 2023-06-04 DIAGNOSIS — M9905 Segmental and somatic dysfunction of pelvic region: Secondary | ICD-10-CM | POA: Diagnosis not present

## 2023-06-04 DIAGNOSIS — M9901 Segmental and somatic dysfunction of cervical region: Secondary | ICD-10-CM | POA: Diagnosis not present

## 2023-06-15 DIAGNOSIS — K219 Gastro-esophageal reflux disease without esophagitis: Secondary | ICD-10-CM | POA: Diagnosis not present

## 2023-06-15 DIAGNOSIS — F411 Generalized anxiety disorder: Secondary | ICD-10-CM | POA: Diagnosis not present

## 2023-06-15 DIAGNOSIS — M542 Cervicalgia: Secondary | ICD-10-CM | POA: Diagnosis not present

## 2023-06-16 DIAGNOSIS — M7912 Myalgia of auxiliary muscles, head and neck: Secondary | ICD-10-CM | POA: Diagnosis not present

## 2023-06-16 DIAGNOSIS — M9905 Segmental and somatic dysfunction of pelvic region: Secondary | ICD-10-CM | POA: Diagnosis not present

## 2023-06-16 DIAGNOSIS — M9901 Segmental and somatic dysfunction of cervical region: Secondary | ICD-10-CM | POA: Diagnosis not present

## 2023-06-16 DIAGNOSIS — M461 Sacroiliitis, not elsewhere classified: Secondary | ICD-10-CM | POA: Diagnosis not present

## 2023-06-18 DIAGNOSIS — G43109 Migraine with aura, not intractable, without status migrainosus: Secondary | ICD-10-CM | POA: Diagnosis not present

## 2023-06-23 DIAGNOSIS — M461 Sacroiliitis, not elsewhere classified: Secondary | ICD-10-CM | POA: Diagnosis not present

## 2023-06-23 DIAGNOSIS — M9901 Segmental and somatic dysfunction of cervical region: Secondary | ICD-10-CM | POA: Diagnosis not present

## 2023-06-23 DIAGNOSIS — M9905 Segmental and somatic dysfunction of pelvic region: Secondary | ICD-10-CM | POA: Diagnosis not present

## 2023-06-23 DIAGNOSIS — M7912 Myalgia of auxiliary muscles, head and neck: Secondary | ICD-10-CM | POA: Diagnosis not present

## 2023-07-06 DIAGNOSIS — M9901 Segmental and somatic dysfunction of cervical region: Secondary | ICD-10-CM | POA: Diagnosis not present

## 2023-07-06 DIAGNOSIS — M9905 Segmental and somatic dysfunction of pelvic region: Secondary | ICD-10-CM | POA: Diagnosis not present

## 2023-07-06 DIAGNOSIS — M7912 Myalgia of auxiliary muscles, head and neck: Secondary | ICD-10-CM | POA: Diagnosis not present

## 2023-07-06 DIAGNOSIS — M461 Sacroiliitis, not elsewhere classified: Secondary | ICD-10-CM | POA: Diagnosis not present

## 2023-07-16 DIAGNOSIS — G8929 Other chronic pain: Secondary | ICD-10-CM | POA: Diagnosis not present

## 2023-07-16 DIAGNOSIS — M545 Low back pain, unspecified: Secondary | ICD-10-CM | POA: Diagnosis not present

## 2023-07-16 DIAGNOSIS — Z981 Arthrodesis status: Secondary | ICD-10-CM | POA: Diagnosis not present

## 2023-07-16 DIAGNOSIS — M542 Cervicalgia: Secondary | ICD-10-CM | POA: Diagnosis not present

## 2023-07-27 DIAGNOSIS — M542 Cervicalgia: Secondary | ICD-10-CM | POA: Diagnosis not present

## 2023-07-27 DIAGNOSIS — M4802 Spinal stenosis, cervical region: Secondary | ICD-10-CM | POA: Diagnosis not present

## 2023-07-27 DIAGNOSIS — M48061 Spinal stenosis, lumbar region without neurogenic claudication: Secondary | ICD-10-CM | POA: Diagnosis not present

## 2023-07-27 DIAGNOSIS — M50221 Other cervical disc displacement at C4-C5 level: Secondary | ICD-10-CM | POA: Diagnosis not present

## 2023-07-27 DIAGNOSIS — M2548 Effusion, other site: Secondary | ICD-10-CM | POA: Diagnosis not present

## 2023-07-27 DIAGNOSIS — Z981 Arthrodesis status: Secondary | ICD-10-CM | POA: Diagnosis not present

## 2023-07-27 DIAGNOSIS — M5116 Intervertebral disc disorders with radiculopathy, lumbar region: Secondary | ICD-10-CM | POA: Diagnosis not present

## 2023-07-27 DIAGNOSIS — G8929 Other chronic pain: Secondary | ICD-10-CM | POA: Diagnosis not present

## 2023-07-27 DIAGNOSIS — M545 Low back pain, unspecified: Secondary | ICD-10-CM | POA: Diagnosis not present

## 2023-07-30 DIAGNOSIS — M542 Cervicalgia: Secondary | ICD-10-CM | POA: Diagnosis not present

## 2023-07-30 DIAGNOSIS — M545 Low back pain, unspecified: Secondary | ICD-10-CM | POA: Diagnosis not present

## 2023-07-30 DIAGNOSIS — G8929 Other chronic pain: Secondary | ICD-10-CM | POA: Diagnosis not present

## 2023-08-07 DIAGNOSIS — J22 Unspecified acute lower respiratory infection: Secondary | ICD-10-CM | POA: Diagnosis not present

## 2023-08-07 DIAGNOSIS — R058 Other specified cough: Secondary | ICD-10-CM | POA: Diagnosis not present

## 2023-08-13 DIAGNOSIS — R052 Subacute cough: Secondary | ICD-10-CM | POA: Diagnosis not present

## 2023-08-13 DIAGNOSIS — J208 Acute bronchitis due to other specified organisms: Secondary | ICD-10-CM | POA: Diagnosis not present

## 2023-08-13 DIAGNOSIS — R059 Cough, unspecified: Secondary | ICD-10-CM | POA: Diagnosis not present

## 2023-08-26 DIAGNOSIS — M545 Low back pain, unspecified: Secondary | ICD-10-CM | POA: Diagnosis not present

## 2023-08-26 DIAGNOSIS — G8929 Other chronic pain: Secondary | ICD-10-CM | POA: Diagnosis not present

## 2023-08-26 DIAGNOSIS — M542 Cervicalgia: Secondary | ICD-10-CM | POA: Diagnosis not present

## 2023-08-26 DIAGNOSIS — R293 Abnormal posture: Secondary | ICD-10-CM | POA: Diagnosis not present

## 2023-09-07 DIAGNOSIS — E1165 Type 2 diabetes mellitus with hyperglycemia: Secondary | ICD-10-CM | POA: Diagnosis not present

## 2023-09-07 DIAGNOSIS — E039 Hypothyroidism, unspecified: Secondary | ICD-10-CM | POA: Diagnosis not present

## 2023-09-11 DIAGNOSIS — M542 Cervicalgia: Secondary | ICD-10-CM | POA: Diagnosis not present

## 2023-09-11 DIAGNOSIS — R293 Abnormal posture: Secondary | ICD-10-CM | POA: Diagnosis not present

## 2023-09-11 DIAGNOSIS — M545 Low back pain, unspecified: Secondary | ICD-10-CM | POA: Diagnosis not present

## 2023-09-11 DIAGNOSIS — G8929 Other chronic pain: Secondary | ICD-10-CM | POA: Diagnosis not present

## 2023-09-15 DIAGNOSIS — G8929 Other chronic pain: Secondary | ICD-10-CM | POA: Diagnosis not present

## 2023-09-15 DIAGNOSIS — M542 Cervicalgia: Secondary | ICD-10-CM | POA: Diagnosis not present

## 2023-09-15 DIAGNOSIS — M545 Low back pain, unspecified: Secondary | ICD-10-CM | POA: Diagnosis not present

## 2023-09-15 DIAGNOSIS — R293 Abnormal posture: Secondary | ICD-10-CM | POA: Diagnosis not present

## 2023-09-17 DIAGNOSIS — G43109 Migraine with aura, not intractable, without status migrainosus: Secondary | ICD-10-CM | POA: Diagnosis not present

## 2023-09-24 DIAGNOSIS — M545 Low back pain, unspecified: Secondary | ICD-10-CM | POA: Diagnosis not present

## 2023-09-24 DIAGNOSIS — M542 Cervicalgia: Secondary | ICD-10-CM | POA: Diagnosis not present

## 2023-09-24 DIAGNOSIS — R293 Abnormal posture: Secondary | ICD-10-CM | POA: Diagnosis not present

## 2023-09-24 DIAGNOSIS — G8929 Other chronic pain: Secondary | ICD-10-CM | POA: Diagnosis not present

## 2023-10-08 DIAGNOSIS — M545 Low back pain, unspecified: Secondary | ICD-10-CM | POA: Diagnosis not present

## 2023-10-08 DIAGNOSIS — G8929 Other chronic pain: Secondary | ICD-10-CM | POA: Diagnosis not present

## 2023-10-08 DIAGNOSIS — M47812 Spondylosis without myelopathy or radiculopathy, cervical region: Secondary | ICD-10-CM | POA: Diagnosis not present

## 2023-10-16 DIAGNOSIS — R293 Abnormal posture: Secondary | ICD-10-CM | POA: Diagnosis not present

## 2023-10-16 DIAGNOSIS — G8929 Other chronic pain: Secondary | ICD-10-CM | POA: Diagnosis not present

## 2023-10-16 DIAGNOSIS — M542 Cervicalgia: Secondary | ICD-10-CM | POA: Diagnosis not present

## 2023-10-16 DIAGNOSIS — M545 Low back pain, unspecified: Secondary | ICD-10-CM | POA: Diagnosis not present

## 2023-10-23 DIAGNOSIS — G8929 Other chronic pain: Secondary | ICD-10-CM | POA: Diagnosis not present

## 2023-10-23 DIAGNOSIS — M545 Low back pain, unspecified: Secondary | ICD-10-CM | POA: Diagnosis not present

## 2023-10-23 DIAGNOSIS — M542 Cervicalgia: Secondary | ICD-10-CM | POA: Diagnosis not present

## 2023-10-23 DIAGNOSIS — R293 Abnormal posture: Secondary | ICD-10-CM | POA: Diagnosis not present

## 2023-11-18 DIAGNOSIS — M7912 Myalgia of auxiliary muscles, head and neck: Secondary | ICD-10-CM | POA: Diagnosis not present

## 2023-11-18 DIAGNOSIS — M9901 Segmental and somatic dysfunction of cervical region: Secondary | ICD-10-CM | POA: Diagnosis not present

## 2023-11-18 DIAGNOSIS — M9905 Segmental and somatic dysfunction of pelvic region: Secondary | ICD-10-CM | POA: Diagnosis not present

## 2023-11-18 DIAGNOSIS — M461 Sacroiliitis, not elsewhere classified: Secondary | ICD-10-CM | POA: Diagnosis not present

## 2023-11-19 DIAGNOSIS — G8929 Other chronic pain: Secondary | ICD-10-CM | POA: Diagnosis not present

## 2023-11-19 DIAGNOSIS — M542 Cervicalgia: Secondary | ICD-10-CM | POA: Diagnosis not present

## 2023-11-19 DIAGNOSIS — M545 Low back pain, unspecified: Secondary | ICD-10-CM | POA: Diagnosis not present

## 2023-11-26 DIAGNOSIS — M9905 Segmental and somatic dysfunction of pelvic region: Secondary | ICD-10-CM | POA: Diagnosis not present

## 2023-11-26 DIAGNOSIS — M9901 Segmental and somatic dysfunction of cervical region: Secondary | ICD-10-CM | POA: Diagnosis not present

## 2023-11-26 DIAGNOSIS — M461 Sacroiliitis, not elsewhere classified: Secondary | ICD-10-CM | POA: Diagnosis not present

## 2023-11-26 DIAGNOSIS — M7912 Myalgia of auxiliary muscles, head and neck: Secondary | ICD-10-CM | POA: Diagnosis not present

## 2023-11-30 DIAGNOSIS — E039 Hypothyroidism, unspecified: Secondary | ICD-10-CM | POA: Diagnosis not present

## 2023-11-30 DIAGNOSIS — E1165 Type 2 diabetes mellitus with hyperglycemia: Secondary | ICD-10-CM | POA: Diagnosis not present

## 2023-12-03 DIAGNOSIS — M461 Sacroiliitis, not elsewhere classified: Secondary | ICD-10-CM | POA: Diagnosis not present

## 2023-12-03 DIAGNOSIS — M9901 Segmental and somatic dysfunction of cervical region: Secondary | ICD-10-CM | POA: Diagnosis not present

## 2023-12-03 DIAGNOSIS — M9905 Segmental and somatic dysfunction of pelvic region: Secondary | ICD-10-CM | POA: Diagnosis not present

## 2023-12-03 DIAGNOSIS — M7912 Myalgia of auxiliary muscles, head and neck: Secondary | ICD-10-CM | POA: Diagnosis not present

## 2023-12-15 DIAGNOSIS — G43909 Migraine, unspecified, not intractable, without status migrainosus: Secondary | ICD-10-CM | POA: Diagnosis not present

## 2023-12-16 DIAGNOSIS — G43709 Chronic migraine without aura, not intractable, without status migrainosus: Secondary | ICD-10-CM | POA: Diagnosis not present

## 2023-12-17 DIAGNOSIS — M461 Sacroiliitis, not elsewhere classified: Secondary | ICD-10-CM | POA: Diagnosis not present

## 2023-12-17 DIAGNOSIS — M9901 Segmental and somatic dysfunction of cervical region: Secondary | ICD-10-CM | POA: Diagnosis not present

## 2023-12-17 DIAGNOSIS — M7912 Myalgia of auxiliary muscles, head and neck: Secondary | ICD-10-CM | POA: Diagnosis not present

## 2023-12-17 DIAGNOSIS — M9905 Segmental and somatic dysfunction of pelvic region: Secondary | ICD-10-CM | POA: Diagnosis not present

## 2023-12-24 DIAGNOSIS — R110 Nausea: Secondary | ICD-10-CM | POA: Diagnosis not present

## 2023-12-24 DIAGNOSIS — G44201 Tension-type headache, unspecified, intractable: Secondary | ICD-10-CM | POA: Diagnosis not present

## 2023-12-29 DIAGNOSIS — M9905 Segmental and somatic dysfunction of pelvic region: Secondary | ICD-10-CM | POA: Diagnosis not present

## 2023-12-29 DIAGNOSIS — M461 Sacroiliitis, not elsewhere classified: Secondary | ICD-10-CM | POA: Diagnosis not present

## 2023-12-29 DIAGNOSIS — M9901 Segmental and somatic dysfunction of cervical region: Secondary | ICD-10-CM | POA: Diagnosis not present

## 2023-12-29 DIAGNOSIS — M7912 Myalgia of auxiliary muscles, head and neck: Secondary | ICD-10-CM | POA: Diagnosis not present

## 2024-01-11 DIAGNOSIS — G43109 Migraine with aura, not intractable, without status migrainosus: Secondary | ICD-10-CM | POA: Diagnosis not present

## 2024-01-22 DIAGNOSIS — G43509 Persistent migraine aura without cerebral infarction, not intractable, without status migrainosus: Secondary | ICD-10-CM | POA: Diagnosis not present

## 2024-01-25 DIAGNOSIS — M9901 Segmental and somatic dysfunction of cervical region: Secondary | ICD-10-CM | POA: Diagnosis not present

## 2024-01-25 DIAGNOSIS — M9905 Segmental and somatic dysfunction of pelvic region: Secondary | ICD-10-CM | POA: Diagnosis not present

## 2024-01-25 DIAGNOSIS — M7912 Myalgia of auxiliary muscles, head and neck: Secondary | ICD-10-CM | POA: Diagnosis not present

## 2024-01-25 DIAGNOSIS — M461 Sacroiliitis, not elsewhere classified: Secondary | ICD-10-CM | POA: Diagnosis not present

## 2024-02-17 DIAGNOSIS — E782 Mixed hyperlipidemia: Secondary | ICD-10-CM | POA: Diagnosis not present

## 2024-02-17 DIAGNOSIS — Z794 Long term (current) use of insulin: Secondary | ICD-10-CM | POA: Diagnosis not present

## 2024-02-17 DIAGNOSIS — R809 Proteinuria, unspecified: Secondary | ICD-10-CM | POA: Diagnosis not present

## 2024-02-17 DIAGNOSIS — E1129 Type 2 diabetes mellitus with other diabetic kidney complication: Secondary | ICD-10-CM | POA: Diagnosis not present

## 2024-02-17 DIAGNOSIS — I10 Essential (primary) hypertension: Secondary | ICD-10-CM | POA: Diagnosis not present

## 2024-02-17 DIAGNOSIS — E039 Hypothyroidism, unspecified: Secondary | ICD-10-CM | POA: Diagnosis not present

## 2024-02-17 DIAGNOSIS — E1165 Type 2 diabetes mellitus with hyperglycemia: Secondary | ICD-10-CM | POA: Diagnosis not present

## 2024-03-02 DIAGNOSIS — J3489 Other specified disorders of nose and nasal sinuses: Secondary | ICD-10-CM | POA: Diagnosis not present

## 2024-03-02 DIAGNOSIS — R519 Headache, unspecified: Secondary | ICD-10-CM | POA: Diagnosis not present

## 2024-03-02 DIAGNOSIS — U071 COVID-19: Secondary | ICD-10-CM | POA: Diagnosis not present

## 2024-03-02 DIAGNOSIS — H9203 Otalgia, bilateral: Secondary | ICD-10-CM | POA: Diagnosis not present

## 2024-03-14 DIAGNOSIS — E785 Hyperlipidemia, unspecified: Secondary | ICD-10-CM | POA: Diagnosis not present

## 2024-03-14 DIAGNOSIS — E1165 Type 2 diabetes mellitus with hyperglycemia: Secondary | ICD-10-CM | POA: Diagnosis not present

## 2024-03-16 DIAGNOSIS — G43709 Chronic migraine without aura, not intractable, without status migrainosus: Secondary | ICD-10-CM | POA: Diagnosis not present

## 2024-03-23 DIAGNOSIS — M9905 Segmental and somatic dysfunction of pelvic region: Secondary | ICD-10-CM | POA: Diagnosis not present

## 2024-03-23 DIAGNOSIS — M9901 Segmental and somatic dysfunction of cervical region: Secondary | ICD-10-CM | POA: Diagnosis not present

## 2024-03-23 DIAGNOSIS — M7912 Myalgia of auxiliary muscles, head and neck: Secondary | ICD-10-CM | POA: Diagnosis not present

## 2024-03-23 DIAGNOSIS — M461 Sacroiliitis, not elsewhere classified: Secondary | ICD-10-CM | POA: Diagnosis not present

## 2024-04-05 DIAGNOSIS — R8290 Unspecified abnormal findings in urine: Secondary | ICD-10-CM | POA: Diagnosis not present

## 2024-04-07 DIAGNOSIS — M9905 Segmental and somatic dysfunction of pelvic region: Secondary | ICD-10-CM | POA: Diagnosis not present

## 2024-04-07 DIAGNOSIS — M461 Sacroiliitis, not elsewhere classified: Secondary | ICD-10-CM | POA: Diagnosis not present

## 2024-04-07 DIAGNOSIS — M7912 Myalgia of auxiliary muscles, head and neck: Secondary | ICD-10-CM | POA: Diagnosis not present

## 2024-04-07 DIAGNOSIS — M9901 Segmental and somatic dysfunction of cervical region: Secondary | ICD-10-CM | POA: Diagnosis not present

## 2024-04-18 DIAGNOSIS — M9905 Segmental and somatic dysfunction of pelvic region: Secondary | ICD-10-CM | POA: Diagnosis not present

## 2024-04-18 DIAGNOSIS — M9901 Segmental and somatic dysfunction of cervical region: Secondary | ICD-10-CM | POA: Diagnosis not present

## 2024-04-18 DIAGNOSIS — M7912 Myalgia of auxiliary muscles, head and neck: Secondary | ICD-10-CM | POA: Diagnosis not present

## 2024-04-18 DIAGNOSIS — M461 Sacroiliitis, not elsewhere classified: Secondary | ICD-10-CM | POA: Diagnosis not present

## 2024-05-03 DIAGNOSIS — F418 Other specified anxiety disorders: Secondary | ICD-10-CM | POA: Diagnosis not present

## 2024-05-10 DIAGNOSIS — M7912 Myalgia of auxiliary muscles, head and neck: Secondary | ICD-10-CM | POA: Diagnosis not present

## 2024-05-10 DIAGNOSIS — M461 Sacroiliitis, not elsewhere classified: Secondary | ICD-10-CM | POA: Diagnosis not present

## 2024-05-10 DIAGNOSIS — M9905 Segmental and somatic dysfunction of pelvic region: Secondary | ICD-10-CM | POA: Diagnosis not present

## 2024-05-10 DIAGNOSIS — M9901 Segmental and somatic dysfunction of cervical region: Secondary | ICD-10-CM | POA: Diagnosis not present

## 2024-05-16 DIAGNOSIS — M9905 Segmental and somatic dysfunction of pelvic region: Secondary | ICD-10-CM | POA: Diagnosis not present

## 2024-05-16 DIAGNOSIS — M7912 Myalgia of auxiliary muscles, head and neck: Secondary | ICD-10-CM | POA: Diagnosis not present

## 2024-05-16 DIAGNOSIS — M9901 Segmental and somatic dysfunction of cervical region: Secondary | ICD-10-CM | POA: Diagnosis not present

## 2024-05-16 DIAGNOSIS — M461 Sacroiliitis, not elsewhere classified: Secondary | ICD-10-CM | POA: Diagnosis not present

## 2024-05-18 DIAGNOSIS — E559 Vitamin D deficiency, unspecified: Secondary | ICD-10-CM | POA: Diagnosis not present

## 2024-05-18 DIAGNOSIS — Z23 Encounter for immunization: Secondary | ICD-10-CM | POA: Diagnosis not present

## 2024-05-18 DIAGNOSIS — Z6832 Body mass index (BMI) 32.0-32.9, adult: Secondary | ICD-10-CM | POA: Diagnosis not present

## 2024-05-18 DIAGNOSIS — J4530 Mild persistent asthma, uncomplicated: Secondary | ICD-10-CM | POA: Diagnosis not present

## 2024-05-18 DIAGNOSIS — F411 Generalized anxiety disorder: Secondary | ICD-10-CM | POA: Diagnosis not present

## 2024-05-18 DIAGNOSIS — E1165 Type 2 diabetes mellitus with hyperglycemia: Secondary | ICD-10-CM | POA: Diagnosis not present
